# Patient Record
Sex: Female | Born: 1965 | Race: White | Hispanic: No | Marital: Single | Smoking: Never smoker
Health system: Southern US, Community
[De-identification: ages and names within clinical notes are randomized; demographics above are authoritative.]

## PROBLEM LIST (undated history)

## (undated) DIAGNOSIS — I471 Supraventricular tachycardia, unspecified: Secondary | ICD-10-CM

## (undated) DIAGNOSIS — E119 Type 2 diabetes mellitus without complications: Secondary | ICD-10-CM

## (undated) DIAGNOSIS — G43101 Migraine with aura, not intractable, with status migrainosus: Secondary | ICD-10-CM

## (undated) DIAGNOSIS — M7501 Adhesive capsulitis of right shoulder: Secondary | ICD-10-CM

## (undated) DIAGNOSIS — N950 Postmenopausal bleeding: Secondary | ICD-10-CM

## (undated) DIAGNOSIS — R87610 Atypical squamous cells of undetermined significance on cytologic smear of cervix (ASC-US): Secondary | ICD-10-CM

## (undated) HISTORY — DX: Migraine with aura, not intractable, with status migrainosus: G43.101

## (undated) HISTORY — PX: CHOLECYSTECTOMY: SHX55

## (undated) HISTORY — PX: APPENDECTOMY: SHX54

## (undated) HISTORY — DX: Atypical squamous cells of undetermined significance on cytologic smear of cervix (ASC-US): R87.610

## (undated) HISTORY — DX: Supraventricular tachycardia: I47.1

## (undated) HISTORY — DX: Postmenopausal bleeding: N95.0

## (undated) HISTORY — DX: Supraventricular tachycardia, unspecified: I47.10

## (undated) HISTORY — DX: Type 2 diabetes mellitus without complications: E11.9

## (undated) HISTORY — DX: Adhesive capsulitis of right shoulder: M75.01

---

## 1996-11-11 HISTORY — PX: GALLBLADDER SURGERY: SHX652

## 2001-09-09 LAB — HIV ANTIBODY (ROUTINE TESTING W REFLEX): HIV 1&2 Ab, 4th Generation: NEGATIVE

## 2014-06-06 DIAGNOSIS — Z794 Long term (current) use of insulin: Secondary | ICD-10-CM | POA: Insufficient documentation

## 2014-06-06 DIAGNOSIS — E119 Type 2 diabetes mellitus without complications: Secondary | ICD-10-CM | POA: Insufficient documentation

## 2014-06-06 DIAGNOSIS — Z8742 Personal history of other diseases of the female genital tract: Secondary | ICD-10-CM | POA: Insufficient documentation

## 2014-06-06 LAB — HM PAP SMEAR: HM Pap smear: NORMAL

## 2014-11-11 HISTORY — PX: SHOULDER SURGERY: SHX246

## 2014-11-24 DIAGNOSIS — M7521 Bicipital tendinitis, right shoulder: Secondary | ICD-10-CM | POA: Insufficient documentation

## 2014-11-24 DIAGNOSIS — M7501 Adhesive capsulitis of right shoulder: Secondary | ICD-10-CM | POA: Insufficient documentation

## 2014-11-24 DIAGNOSIS — M25511 Pain in right shoulder: Secondary | ICD-10-CM | POA: Insufficient documentation

## 2015-01-26 DIAGNOSIS — M75111 Incomplete rotator cuff tear or rupture of right shoulder, not specified as traumatic: Secondary | ICD-10-CM | POA: Insufficient documentation

## 2016-10-01 DIAGNOSIS — R002 Palpitations: Secondary | ICD-10-CM | POA: Insufficient documentation

## 2016-10-01 DIAGNOSIS — R0789 Other chest pain: Secondary | ICD-10-CM | POA: Insufficient documentation

## 2017-12-16 DIAGNOSIS — IMO0002 Reserved for concepts with insufficient information to code with codable children: Secondary | ICD-10-CM | POA: Insufficient documentation

## 2018-04-14 DIAGNOSIS — I471 Supraventricular tachycardia: Secondary | ICD-10-CM | POA: Insufficient documentation

## 2018-04-24 LAB — MICROALBUMIN, URINE: Microalb, Ur: NORMAL

## 2018-06-12 LAB — HM COLONOSCOPY

## 2018-09-29 LAB — HM MAMMOGRAPHY

## 2018-10-11 DIAGNOSIS — R87610 Atypical squamous cells of undetermined significance on cytologic smear of cervix (ASC-US): Secondary | ICD-10-CM | POA: Insufficient documentation

## 2018-10-13 LAB — CBC AND DIFFERENTIAL
HCT: 43 (ref 36–46)
Hemoglobin: 13.8 (ref 12.0–16.0)
Platelets: 242 (ref 150–399)
WBC: 4.5

## 2018-10-13 LAB — BASIC METABOLIC PANEL
BUN: 11 (ref 4–21)
CO2: 27 — AB (ref 13–22)
Chloride: 111 — AB (ref 99–108)
Creatinine: 1.1 (ref 0.5–1.1)
Glucose: 110
Potassium: 4.4 (ref 3.4–5.3)
Sodium: 141 (ref 137–147)

## 2018-10-13 LAB — HEPATIC FUNCTION PANEL
ALT: 27 (ref 7–35)
AST: 21 (ref 13–35)
Alkaline Phosphatase: 102 (ref 25–125)
Bilirubin, Total: 0.9

## 2018-10-13 LAB — LIPID PANEL
Cholesterol: 111 (ref 0–200)
HDL: 41 (ref 35–70)
LDL Cholesterol: 57
Triglycerides: 65 (ref 40–160)

## 2018-10-13 LAB — HEMOGLOBIN A1C: Hemoglobin A1C: 6.6

## 2018-10-13 LAB — TSH: TSH: 2.42 (ref 0.41–5.90)

## 2018-10-13 LAB — COMPREHENSIVE METABOLIC PANEL: Calcium: 8.7 (ref 8.7–10.7)

## 2018-10-13 LAB — VITAMIN B12: Vitamin B-12: 238

## 2019-07-09 LAB — BASIC METABOLIC PANEL
BUN: 17 (ref 4–21)
CO2: 26 — AB (ref 13–22)
Chloride: 106 (ref 99–108)
Creatinine: 1.2 — AB (ref 0.5–1.1)
Glucose: 111
Potassium: 4.3 (ref 3.4–5.3)
Sodium: 141 (ref 137–147)

## 2019-07-09 LAB — LIPID PANEL
Cholesterol: 106 (ref 0–200)
HDL: 42 (ref 35–70)
LDL Cholesterol: 50
Triglycerides: 68 (ref 40–160)

## 2019-07-09 LAB — COMPREHENSIVE METABOLIC PANEL: Calcium: 9 (ref 8.7–10.7)

## 2019-08-12 LAB — HM DIABETES EYE EXAM

## 2020-01-10 ENCOUNTER — Ambulatory Visit: Payer: Self-pay | Admitting: Family

## 2020-01-12 ENCOUNTER — Encounter: Payer: Self-pay | Admitting: Family

## 2020-01-12 DIAGNOSIS — N950 Postmenopausal bleeding: Secondary | ICD-10-CM | POA: Insufficient documentation

## 2020-01-14 ENCOUNTER — Ambulatory Visit (INDEPENDENT_AMBULATORY_CARE_PROVIDER_SITE_OTHER): Payer: 59 | Admitting: Family

## 2020-01-14 ENCOUNTER — Encounter: Payer: Self-pay | Admitting: Family

## 2020-01-14 ENCOUNTER — Encounter (INDEPENDENT_AMBULATORY_CARE_PROVIDER_SITE_OTHER): Payer: Self-pay

## 2020-01-14 ENCOUNTER — Other Ambulatory Visit: Payer: Self-pay

## 2020-01-14 VITALS — BP 120/80 | HR 69 | Temp 97.7°F | Ht 66.8 in | Wt 234.0 lb

## 2020-01-14 DIAGNOSIS — B379 Candidiasis, unspecified: Secondary | ICD-10-CM | POA: Diagnosis not present

## 2020-01-14 DIAGNOSIS — I471 Supraventricular tachycardia: Secondary | ICD-10-CM

## 2020-01-14 DIAGNOSIS — Z6836 Body mass index (BMI) 36.0-36.9, adult: Secondary | ICD-10-CM | POA: Diagnosis not present

## 2020-01-14 DIAGNOSIS — F4310 Post-traumatic stress disorder, unspecified: Secondary | ICD-10-CM

## 2020-01-14 DIAGNOSIS — E119 Type 2 diabetes mellitus without complications: Secondary | ICD-10-CM | POA: Diagnosis not present

## 2020-01-14 DIAGNOSIS — Z794 Long term (current) use of insulin: Secondary | ICD-10-CM

## 2020-01-14 DIAGNOSIS — E669 Obesity, unspecified: Secondary | ICD-10-CM

## 2020-01-14 MED ORDER — ATORVASTATIN CALCIUM 20 MG PO TABS: 20.0000 mg | ORAL_TABLET | Freq: Every day | ORAL | 0 refills | Status: DC

## 2020-01-14 MED ORDER — LOSARTAN POTASSIUM 25 MG PO TABS: 25.0000 mg | ORAL_TABLET | Freq: Every day | ORAL | 3 refills | Status: DC

## 2020-01-14 MED ORDER — FLUCONAZOLE 150 MG PO TABS: 150.0000 mg | ORAL_TABLET | Freq: Once | ORAL | 0 refills | Status: AC

## 2020-01-14 NOTE — Patient Instructions (Signed)
Continue on current medication.

## 2020-01-14 NOTE — Progress Notes (Signed)
Provider: Webb Silversmith Amylee Lodato FNP-C   Shari Prows, [facility]  Patient Care Team: [facility] as PCP - General  Extended Emergency Contact Information Primary Emergency Contact: Arnoldo Morale Mobile Phone: [phone] Relation: Other  Code Status: DNR  Goals of care: Advanced Directive information Advanced Directives 01/14/2020  Does Patient Have a Medical Advance Directive? Yes  Type of Paramedic of [place];Out of facility DNR (pink MOST or yellow form)  Does patient want to make changes to medical advance directive? No - Patient declined  Copy of [place] in Chart? No - copy requested  Pre-existing out of facility DNR order (yellow form or pink MOST form) Yellow form placed in chart (order not valid for inpatient use)     Chief Complaint  Patient presents with  . Establish Care    New Patient    HPI:  Pt is a 54 y.o. female seen today to establish care for medical management of chronic diseases.States recently relocated from [place] previously following up with Dr.Irizarry,LaurenDelia at [facility].she has a medical history of Type 2 Diabetes mellitus,Migraine with Aura,Supraventricular Tachycardia,Right shoulder pain, complex PTSD see a therapist,hx of PCOS, ASCUS of cervix with negative high risk HPV among other conditions. She complains of vaginal discharge and itching.she request referral to Gynecology for pap smear.  Type 2 Diabetes - No home CBG log.Records reviewed Latest Hgb A1C 6.6( 2019) currently on Jardiance 10 mg tablet daily and Levermir 30 units daily.also on Losartan 25 mg tablet daily for renal protection.Atorvastatin 20 mg tablet daily and ASA 81 mg tablet daily for cardiovascular event prevention. She exercise by walking.Just getting to know around her new neighborhood.  Supraventricular Tachycardia - Heart rate controlled on Metoprolol.she was seeing a cardiologist at [facility] currently weaning  off metoprolol 100 mg 24 Hr tablet.States was advised to take 50 mg tablet daily x 2 weeks then every other day for another 2 weeks.she just started weaning off.she denies any palpitation,chest pain or shortness of breath.she states will eventually need refer to a cardiologist here in [place] when she is weaned off metoprolol.   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  Migraine with Aura - has migraine once in a while.Has vision aura.not taking any medication.Has used Imetrex in the past but not helpful.  No fall episodes. Health maintenance -  She due for annual foot exam with Podiatrist but declines states monofilament exam usually done by PCP.Need referral to Gyn this visit for Pap smear. Will schedule for her Physical exam and fasting lab work  Immunization record reviewed up to date.  She status has a Do not Resuscitate order form she will bring form to her next visit.    Past Medical History:  Diagnosis Date  . Adhesive capsulitis of right shoulder   . ASCUS of cervix with negative high risk HPV   . Diabetes mellitus without complication ([place])   . Migraine with aura and with status migrainosus, not intractable   . Postmenopausal bleeding   . SVT (supraventricular tachycardia) (HCC)    Past Surgical History:  Procedure Laterality Date  . APPENDECTOMY    . GALLBLADDER SURGERY  1998  . SHOULDER SURGERY  2016    Allergies  Allergen Reactions  . Metformin Diarrhea and Nausea Only  . Oxycodone Itching  . Oxycodone-Acetaminophen Itching    Allergies  as of 01/14/2020      Reactions   Metformin Diarrhea, Nausea Only   Oxycodone Itching   Oxycodone-acetaminophen Itching      Medication List       Accurate as of January 14, 2020  6:10 PM. If you have any questions, ask your nurse or doctor.        ACCU-CHEK ADVANTAGE H TEST VI USE 1 EACH  (1 STRIP TOTAL) ONCE DAILY TO CHECK BLOOD SUGAR   aspirin 81 MG EC tablet Take 1 tablet by mouth daily.   atorvastatin 20 MG tablet Commonly known as: LIPITOR Take 1 tablet (20 mg total) by mouth daily.   empagliflozin 10 MG Tabs tablet Commonly known as: JARDIANCE Take 10 mg by mouth daily.   Fifty50 Pen Needles 31G X 8 MM Misc Generic drug: Insulin Pen Needle Inject 1 each as directed daily as needed.   fluconazole 150 MG tablet Commonly known as: DIFLUCAN Take 1 tablet (150 mg total) by mouth once for 1 dose. Repeat one tablet in 1 week Started by: Sandrea Hughs, NP   insulin detemir 100 UNIT/ML FlexPen Commonly known as: LEVEMIR Inject 30 Units into the skin daily.   losartan 25 MG tablet Commonly known as: COZAAR Take 1 tablet (25 mg total) by mouth daily.   metoprolol succinate 100 MG 24 hr tablet Commonly known as: TOPROL-XL Take 1 tablet by mouth daily.       Review of Systems  Constitutional: Negative for appetite change, chills, fatigue and fever.  HENT: Negative for congestion, ear discharge, ear pain, postnasal drip, rhinorrhea, sinus pressure, sinus pain, sneezing, sore throat, tinnitus and trouble swallowing.   Eyes: Positive for visual disturbance. Negative for pain, discharge, redness and itching.       Follows up with ophthalmology annually   Respiratory: Negative for cough, chest tightness, shortness of breath and wheezing.   Cardiovascular: Negative for chest pain, palpitations and leg swelling.       ST follows up with Cardiologist   Gastrointestinal: Negative for abdominal distention, abdominal pain, constipation, diarrhea, nausea and vomiting.  Endocrine: Negative for cold intolerance, heat intolerance, polydipsia, polyphagia and polyuria.       Hot flushes   Genitourinary: Positive for vaginal discharge. Negative for difficulty urinating, dysuria, flank pain, frequency, hematuria, urgency, vaginal bleeding and vaginal pain.       Vaginal  itching  Musculoskeletal: Positive for arthralgias. Negative for gait problem and myalgias.       Bilateral shoulder stiffness   Neurological: Negative for dizziness, syncope, speech difficulty, weakness, light-headedness, numbness and headaches.       Occasional migraines   Hematological: Does not bruise/bleed easily.  Psychiatric/Behavioral: Negative for agitation, confusion, self-injury, sleep disturbance and suicidal ideas. The patient is not nervous/anxious.        Complex PTSD sees Therapist     Immunization History  Administered Date(s) Administered  . Influenza,inj,Quad PF,6+ Mos 07/21/2015, 08/10/2016, 09/12/2017, 11/10/2018  . Influenza-Unspecified 08/19/2014, 08/21/2019  . Pneumococcal Polysaccharide-23 07/21/2015  . Tdap 06/21/2014  . Zoster Recombinat (Shingrix) 07/14/2018, 10/13/2018   Pertinent  Health Maintenance Due  Topic Date Due  . FOOT EXAM  03/23/1976  . PAP SMEAR-Modifier  06/06/2017  . HEMOGLOBIN A1C  04/14/2019  . OPHTHALMOLOGY EXAM  08/11/2020  . MAMMOGRAM  09/29/2020  . COLONOSCOPY  06/12/2028  . INFLUENZA VACCINE  Completed   Fall Risk  01/14/2020  Falls in the past year? 0  Number falls in past yr: 0  Injury with  Fall? 0    Vitals:   01/14/20 1052  BP: 120/80  Pulse: 69  Temp: 97.7 F (36.5 C)  TempSrc: Tympanic  SpO2: 96%  Weight: 234 lb (106.1 kg)  Height: 5' 6.8" (1.697 m)   Body mass index is 36.87 kg/m. Physical Exam Vitals reviewed.  Constitutional:      General: She is not in acute distress.    Appearance: She is obese. She is not ill-appearing.  HENT:     Head: Normocephalic.     Right Ear: Tympanic membrane, ear canal and external ear normal. There is no impacted cerumen.     Left Ear: Tympanic membrane, ear canal and external ear normal. There is no impacted cerumen.     Nose: Nose normal. No congestion or rhinorrhea.     Mouth/Throat:     Mouth: Mucous membranes are moist.     Pharynx: Oropharynx is clear. No  oropharyngeal exudate or posterior oropharyngeal erythema.  Eyes:     General: No scleral icterus.       Right eye: No discharge.        Left eye: No discharge.     Extraocular Movements: Extraocular movements intact.     Conjunctiva/sclera: Conjunctivae normal.     Pupils: Pupils are equal, round, and reactive to light.     Comments: Corrective lens in place   Neck:     Vascular: No carotid bruit.  Cardiovascular:     Rate and Rhythm: Normal rate and regular rhythm.     Pulses: Normal pulses.     Heart sounds: Normal heart sounds. No murmur. No friction rub. No gallop.   Pulmonary:     Effort: Pulmonary effort is normal. No respiratory distress.     Breath sounds: Normal breath sounds. No wheezing, rhonchi or rales.  Chest:     Chest wall: No tenderness.  Abdominal:     General: Bowel sounds are normal. There is no distension.     Palpations: Abdomen is soft. There is no mass.     Tenderness: There is no abdominal tenderness. There is no right CVA tenderness, left CVA tenderness, guarding or rebound.  Musculoskeletal:        General: No swelling or tenderness. Normal range of motion.     Cervical back: Normal range of motion. No rigidity or tenderness.     Right lower leg: No edema.     Left lower leg: No edema.  Lymphadenopathy:     Cervical: No cervical adenopathy.  Skin:    General: Skin is warm.     Coloration: Skin is not jaundiced or pale.     Findings: No bruising, erythema or rash.  Neurological:     Mental Status: She is alert and oriented to person, place, and time.     Cranial Nerves: No cranial nerve deficit.     Motor: No weakness.     Coordination: Coordination normal.     Gait: Gait normal.  Psychiatric:        Mood and Affect: Mood normal.        Behavior: Behavior normal.        Thought Content: Thought content normal.        Judgment: Judgment normal.    Labs reviewed: Recent Labs    07/09/19 0000  NA 141  K 4.3  CL 106  CO2 26*  BUN 17    CREATININE 1.2*  CALCIUM 9.0    Lab Results  Component Value Date  TSH 2.42 10/13/2018   Lab Results  Component Value Date   HGBA1C 6.6 10/13/2018   Lab Results  Component Value Date   CHOL 106 07/09/2019   HDL 42 07/09/2019   LDLCALC 50 07/09/2019   TRIG 68 07/09/2019    Significant Diagnostic Results in last 30 days:  No results found.  Assessment/Plan 1. Type 2 diabetes mellitus without complication, with long-term current use of insulin (HCC) Lab Results  Component Value Date   HGBA1C 6.6 10/13/2018  No CBG for review.continue on Jardiance 10 mg tablet daily and Levermir 30 units daily.Continue on Losartan 25 mg tablet daily for renal protection.Atorvastatin 20 mg tablet daily and ASA 81 mg tablet daily for cardiovascular event prevention. - latest MicroAlbumin ration < 2.0  - Latest LDL at goal. - losartan (COZAAR) 25 MG tablet; Take 1 tablet (25 mg total) by mouth daily.  Dispense: 30 tablet; Refill: 3 - atorvastatin (LIPITOR) 20 MG tablet; Take 1 tablet (20 mg total) by mouth daily.  Dispense: 30 tablet; Refill: 0 - CBC with Differential/Platelet; Future - CMP with eGFR([facility]); Future - TSH; Future - Lipid panel; Future - Vitamin B12; Future  2. Candidosis Multiple infections since starting on Jardice  - fluconazole (DIFLUCAN) 150 MG tablet; Take 1 tablet (150 mg total) by mouth once for 1 dose. Repeat one tablet in 1 week  Dispense: 2 tablet; Refill: 0 - Ambulatory referral to Gynecology  3. SVT (supraventricular tachycardia) (HCC) Heart Rate controlled.Cardiologist weaning off metoprolol  - continue Metoprolol as directed by Cardiologist.  - CBC with Differential/Platelet; Future - CMP with eGFR([facility]); Future  4. Body mass index (BMI) of 36.0-36.9 in adult BMI 36.87  - continue dietary modification and exercises.Advised to exercise at least 3 times per week for 30 minutes. - CBC with Differential/Platelet; Future - CMP with eGFR([facility]); Future -  Lipid panel; Future  5. Obesity (BMI 30-39.9) Dietary and lifestyle modification advised as above. - CBC with Differential/Platelet; Future - CMP with eGFR([facility]); Future - Lipid panel; Future  6. PTSD (post-traumatic stress disorder) Continue to follow up with Therapist. - TSH; Future  Family/ staff Communication: Reviewed plan of care with patient verbalized understanding.  Labs/tests ordered:  - CBC with Differential/Platelet; Future - CMP with eGFR([facility]); Future - TSH; Future - Lipid panel; Future - Vitamin B12; Future  Next Appointment : 2 weeks for Physical Exam and 2-4 days for lab work prior to visit.  Sandrea Hughs, NP

## 2020-01-27 ENCOUNTER — Other Ambulatory Visit: Payer: Self-pay

## 2020-01-28 ENCOUNTER — Ambulatory Visit (INDEPENDENT_AMBULATORY_CARE_PROVIDER_SITE_OTHER): Payer: 59 | Admitting: Obstetrics & Gynecology

## 2020-01-28 ENCOUNTER — Encounter: Payer: Self-pay | Admitting: Obstetrics & Gynecology

## 2020-01-28 VITALS — BP 124/80 | Ht 67.0 in | Wt 235.0 lb

## 2020-01-28 DIAGNOSIS — R87610 Atypical squamous cells of undetermined significance on cytologic smear of cervix (ASC-US): Secondary | ICD-10-CM | POA: Diagnosis not present

## 2020-01-28 DIAGNOSIS — Z01419 Encounter for gynecological examination (general) (routine) without abnormal findings: Secondary | ICD-10-CM | POA: Diagnosis not present

## 2020-01-28 DIAGNOSIS — Z6836 Body mass index (BMI) 36.0-36.9, adult: Secondary | ICD-10-CM

## 2020-01-28 DIAGNOSIS — B3731 Acute candidiasis of vulva and vagina: Secondary | ICD-10-CM

## 2020-01-28 DIAGNOSIS — B373 Candidiasis of vulva and vagina: Secondary | ICD-10-CM | POA: Diagnosis not present

## 2020-01-28 DIAGNOSIS — Z780 Asymptomatic menopausal state: Secondary | ICD-10-CM

## 2020-01-28 DIAGNOSIS — Z1151 Encounter for screening for human papillomavirus (HPV): Secondary | ICD-10-CM

## 2020-01-28 MED ORDER — FLUCONAZOLE 150 MG PO TABS: 150.0000 mg | ORAL_TABLET | ORAL | 4 refills | Status: DC
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
## 2020-01-28 NOTE — Addendum Note (Signed)
Addended by: Berna Spare A on: 01/28/2020 09:38 AM   Modules accepted: Orders

## 2020-01-28 NOTE — Patient Instructions (Signed)
1. Encounter for routine gynecological examination with Papanicolaou smear of cervix Normal gynecologic exam in menopause.  Pap test with high-risk HPV done today.  Breast exam normal.  Will schedule a screening mammogram at the breast center now.  Colonoscopy in 2019.  Health labs with family physician schedule next week.  IDDM also on Jardiance.  2. ASCUS of cervix with negative high risk HPV Last Pap test showed ASCUS with negative high-risk HPV.  Pap test with high-risk HPV done today.  3. Postmenopause Well on no hormone replacement therapy.  No postmenopausal bleeding since summer 2019 for which an endometrial biopsy was done and showed benign results.  4. Yeast vaginitis Recurrent yeast vaginitis since started on Jardiance.  Fluconazole 150 mg 1 tablet every 2 weeks for prevention and treatment.  Usage reviewed and prescription sent to pharmacy.  Could also use boric acid over-the-counter.  5. Class 2 severe obesity due to excess calories with serious comorbidity and body mass index (BMI) of 36.0 to 36.9 in adult [facility]) Patient is on a diabetic mellitus diet.  Will adjust the total calories per day and increase fitness activities as she settles in [place].  Other orders - fluconazole (DIFLUCAN) 150 MG tablet; Take 1 tablet (150 mg total) by mouth every 14 (fourteen) days.  Glorious, it was a pleasure meeting you today!  I will inform you of your results as soon as they are available.

## 2020-01-31 ENCOUNTER — Other Ambulatory Visit: Payer: Self-pay | Admitting: Family

## 2020-01-31 DIAGNOSIS — Z1231 Encounter for screening mammogram for malignant neoplasm of breast: Secondary | ICD-10-CM

## 2020-02-01 LAB — PAP, TP IMAGING W/ HPV RNA, RFLX HPV TYPE 16,18/45: HPV DNA High Risk: NOT DETECTED

## 2020-02-02 ENCOUNTER — Encounter: Payer: Self-pay | Admitting: *Deleted

## 2020-02-02 ENCOUNTER — Other Ambulatory Visit: Payer: 59

## 2020-02-02 ENCOUNTER — Other Ambulatory Visit: Payer: Self-pay

## 2020-02-02 DIAGNOSIS — E669 Obesity, unspecified: Secondary | ICD-10-CM

## 2020-02-02 DIAGNOSIS — F4310 Post-traumatic stress disorder, unspecified: Secondary | ICD-10-CM

## 2020-02-02 DIAGNOSIS — I471 Supraventricular tachycardia: Secondary | ICD-10-CM

## 2020-02-02 DIAGNOSIS — Z6836 Body mass index (BMI) 36.0-36.9, adult: Secondary | ICD-10-CM

## 2020-02-02 DIAGNOSIS — Z794 Long term (current) use of insulin: Secondary | ICD-10-CM

## 2020-02-02 DIAGNOSIS — E119 Type 2 diabetes mellitus without complications: Secondary | ICD-10-CM

## 2020-02-02 LAB — CBC WITH DIFFERENTIAL/PLATELET
Absolute Monocytes: 348 cells/uL (ref 200–950)
Basophils Absolute: 59 cells/uL (ref 0–200)
Basophils Relative: 1.2 %
Eosinophils Absolute: 147 cells/uL (ref 15–500)
Eosinophils Relative: 3 %
HCT: 43.9 % (ref 35.0–45.0)
Hemoglobin: 14.5 g/dL (ref 11.7–15.5)
Lymphs Abs: 1509 cells/uL (ref 850–3900)
MCH: 29.5 pg (ref 27.0–33.0)
MCHC: 33 g/dL (ref 32.0–36.0)
MCV: 89.2 fL (ref 80.0–100.0)
MPV: 10.3 fL (ref 7.5–12.5)
Monocytes Relative: 7.1 %
Neutro Abs: 2837 cells/uL (ref 1500–7800)
Neutrophils Relative %: 57.9 %
Platelets: 275 10*3/uL (ref 140–400)
RBC: 4.92 10*6/uL (ref 3.80–5.10)
RDW: 13.8 % (ref 11.0–15.0)
Total Lymphocyte: 30.8 %
WBC: 4.9 10*3/uL (ref 3.8–10.8)

## 2020-02-02 LAB — COMPLETE METABOLIC PANEL WITH GFR
AG Ratio: 1.2 (calc) (ref 1.0–2.5)
ALT: 14 U/L (ref 6–29)
AST: 15 U/L (ref 10–35)
Albumin: 3.7 g/dL (ref 3.6–5.1)
Alkaline phosphatase (APISO): 129 U/L (ref 37–153)
BUN/Creatinine Ratio: 11 (calc) (ref 6–22)
BUN: 13 mg/dL (ref 7–25)
CO2: 26 mmol/L (ref 20–32)
Calcium: 8.8 mg/dL (ref 8.6–10.4)
Chloride: 109 mmol/L (ref 98–110)
Creat: 1.14 mg/dL — ABNORMAL HIGH (ref 0.50–1.05)
GFR, Est African American: 64 mL/min/{1.73_m2} (ref >=60)
GFR, Est Non African American: 55 mL/min/{1.73_m2} — ABNORMAL LOW (ref >=60)
Globulin: 3 g/dL (calc) (ref 1.9–3.7)
Glucose, Bld: 87 mg/dL (ref 65–99)
Potassium: 4 mmol/L (ref 3.5–5.3)
Sodium: 143 mmol/L (ref 135–146)
Total Bilirubin: 0.4 mg/dL (ref 0.2–1.2)
Total Protein: 6.7 g/dL (ref 6.1–8.1)

## 2020-02-02 LAB — LIPID PANEL
Cholesterol: 106 mg/dL (ref <200)
HDL: 44 mg/dL — ABNORMAL LOW (ref >=50)
LDL Cholesterol (Calc): 44 mg/dL (calc)
Non-HDL Cholesterol (Calc): 62 mg/dL (calc) (ref <130)
Total CHOL/HDL Ratio: 2.4 (calc) (ref <5.0)
Triglycerides: 99 mg/dL (ref <150)

## 2020-02-02 LAB — TSH: TSH: 2.05 mIU/L

## 2020-02-02 LAB — VITAMIN B12: Vitamin B-12: 473 pg/mL (ref 200–1100)

## 2020-02-04 ENCOUNTER — Encounter: Payer: Self-pay | Admitting: Family

## 2020-02-04 ENCOUNTER — Ambulatory Visit (INDEPENDENT_AMBULATORY_CARE_PROVIDER_SITE_OTHER): Payer: 59 | Admitting: Family

## 2020-02-04 ENCOUNTER — Other Ambulatory Visit: Payer: Self-pay

## 2020-02-04 VITALS — BP 120/70 | HR 108 | Temp 98.3°F | Ht 67.0 in | Wt 233.0 lb

## 2020-02-04 DIAGNOSIS — Z794 Long term (current) use of insulin: Secondary | ICD-10-CM | POA: Diagnosis not present

## 2020-02-04 DIAGNOSIS — E119 Type 2 diabetes mellitus without complications: Secondary | ICD-10-CM | POA: Diagnosis not present

## 2020-02-04 DIAGNOSIS — I471 Supraventricular tachycardia: Secondary | ICD-10-CM

## 2020-02-04 DIAGNOSIS — Z0001 Encounter for general adult medical examination with abnormal findings: Secondary | ICD-10-CM | POA: Diagnosis not present

## 2020-02-04 NOTE — Patient Instructions (Signed)
Health Maintenance, Female Adopting a healthy lifestyle and getting preventive care are important in promoting health and wellness. Ask your health care provider about:  The right schedule for you to have regular tests and exams.  Things you can do on your own to prevent diseases and keep yourself healthy. What should I know about diet, weight, and exercise? Eat a healthy diet   Eat a diet that includes plenty of vegetables, fruits, low-fat dairy products, and lean protein.  Do not eat a lot of foods that are high in solid fats, added sugars, or sodium. Maintain a healthy weight Body mass index (BMI) is used to identify weight problems. It estimates body fat based on height and weight. Your health care provider can help determine your BMI and help you achieve or maintain a healthy weight. Get regular exercise Get regular exercise. This is one of the most important things you can do for your health. Most adults should:  Exercise for at least 150 minutes each week. The exercise should increase your heart rate and make you sweat (moderate-intensity exercise).  Do strengthening exercises at least twice a week. This is in addition to the moderate-intensity exercise.  Spend less time sitting. Even light physical activity can be beneficial. Watch cholesterol and blood lipids Have your blood tested for lipids and cholesterol at 54 years of age, then have this test every 5 years. Have your cholesterol levels checked more often if:  Your lipid or cholesterol levels are high.  You are older than 54 years of age.  You are at high risk for heart disease. What should I know about cancer screening? Depending on your health history and family history, you may need to have cancer screening at various ages. This may include screening for:  Breast cancer.  Cervical cancer.  Colorectal cancer.  Skin cancer.  Lung cancer. What should I know about heart disease, diabetes, and high blood  pressure? Blood pressure and heart disease  High blood pressure causes heart disease and increases the risk of stroke. This is more likely to develop in people who have high blood pressure readings, are of African descent, or are overweight.  Have your blood pressure checked: ? Every 3-5 years if you are 18-39 years of age. ? Every year if you are 40 years old or older. Diabetes Have regular diabetes screenings. This checks your fasting blood sugar level. Have the screening done:  Once every three years after age 40 if you are at a normal weight and have a low risk for diabetes.  More often and at a younger age if you are overweight or have a high risk for diabetes. What should I know about preventing infection? Hepatitis B If you have a higher risk for hepatitis B, you should be screened for this virus. Talk with your health care provider to find out if you are at risk for hepatitis B infection. Hepatitis C Testing is recommended for:  Everyone born from 1945 through 1965.  Anyone with known risk factors for hepatitis C. Sexually transmitted infections (STIs)  Get screened for STIs, including gonorrhea and chlamydia, if: ? You are sexually active and are younger than 54 years of age. ? You are older than 54 years of age and your health care provider tells you that you are at risk for this type of infection. ? Your sexual activity has changed since you were last screened, and you are at increased risk for chlamydia or gonorrhea. Ask your health care provider if   you are at risk.  Ask your health care provider about whether you are at high risk for HIV. Your health care provider may recommend a prescription medicine to help prevent HIV infection. If you choose to take medicine to prevent HIV, you should first get tested for HIV. You should then be tested every 3 months for as long as you are taking the medicine. Pregnancy  If you are about to stop having your period (premenopausal) and  you may become pregnant, seek counseling before you get pregnant.  Take 400 to 800 micrograms (mcg) of folic acid every day if you become pregnant.  Ask for birth control (contraception) if you want to prevent pregnancy. Osteoporosis and menopause Osteoporosis is a disease in which the bones lose minerals and strength with aging. This can result in bone fractures. If you are 65 years old or older, or if you are at risk for osteoporosis and fractures, ask your health care provider if you should:  Be screened for bone loss.  Take a calcium or vitamin D supplement to lower your risk of fractures.  Be given hormone replacement therapy (HRT) to treat symptoms of menopause. Follow these instructions at home: Lifestyle  Do not use any products that contain nicotine or tobacco, such as cigarettes, e-cigarettes, and chewing tobacco. If you need help quitting, ask your health care provider.  Do not use street drugs.  Do not share needles.  Ask your health care provider for help if you need support or information about quitting drugs. Alcohol use  Do not drink alcohol if: ? Your health care provider tells you not to drink. ? You are pregnant, may be pregnant, or are planning to become pregnant.  If you drink alcohol: ? Limit how much you use to 0-1 drink a day. ? Limit intake if you are breastfeeding.  Be aware of how much alcohol is in your drink. In the [place] one drink equals one 12 oz bottle of beer (355 mL), one 5 oz glass of wine (148 mL), or one 1 oz glass of hard liquor (44 mL). General instructions  Schedule regular health, dental, and eye exams.  Stay current with your vaccines.  Tell your health care provider if: ? You often feel depressed. ? You have ever been abused or do not feel safe at home. Summary  Adopting a healthy lifestyle and getting preventive care are important in promoting health and wellness.  Follow your health care provider's instructions about healthy  diet, exercising, and getting tested or screened for diseases.  Follow your health care provider's instructions on monitoring your cholesterol and blood pressure. This information is not intended to replace advice given to you by your health care provider. Make sure you discuss any questions you have with your health care provider. Document Revised: 10/21/2018 Document Reviewed: 10/21/2018 Elsevier Patient Education  2020 Elsevier Inc.     Why follow it? Research shows. . Those who follow the Mediterranean diet have a reduced risk of heart disease  . The diet is associated with a reduced incidence of Parkinson's and Alzheimer's diseases . People following the diet may have longer life expectancies and lower rates of chronic diseases  . The Dietary Guidelines for Americans recommends the Mediterranean diet as an eating plan to promote health and prevent disease  What Is the Mediterranean Diet?  . Healthy eating plan based on typical foods and recipes of Mediterranean-style cooking . The diet is primarily a plant based diet; these foods should make up   a majority of meals   Starches - Plant based foods should make up a majority of meals - They are an important sources of vitamins, minerals, energy, antioxidants, and fiber - Choose whole grains, foods high in fiber and minimally processed items  - Typical grain sources include wheat, oats, barley, corn, brown rice, bulgar, farro, millet, polenta, couscous  - Various types of beans include chickpeas, lentils, fava beans, black beans, white beans   Fruits  Veggies - Large quantities of antioxidant rich fruits & veggies; 6 or more servings  - Vegetables can be eaten raw or lightly drizzled with oil and cooked  - Vegetables common to the traditional Mediterranean Diet include: artichokes, arugula, beets, broccoli, brussel sprouts, cabbage, carrots, celery, collard greens, cucumbers, eggplant, kale, leeks, lemons, lettuce, mushrooms, okra, onions,  peas, peppers, potatoes, pumpkin, radishes, rutabaga, shallots, spinach, sweet potatoes, turnips, zucchini - Fruits common to the Mediterranean Diet include: apples, apricots, avocados, cherries, clementines, dates, figs, grapefruits, grapes, melons, nectarines, oranges, peaches, pears, pomegranates, strawberries, tangerines  Fats - Replace butter and margarine with healthy oils, such as olive oil, canola oil, and tahini  - Limit nuts to no more than a handful a day  - Nuts include walnuts, almonds, pecans, pistachios, pine nuts  - Limit or avoid candied, honey roasted or heavily salted nuts - Olives are central to the Mediterranean diet - can be eaten whole or used in a variety of dishes   Meats Protein - Limiting red meat: no more than a few times a month - When eating red meat: choose lean cuts and keep the portion to the size of deck of cards - Eggs: approx. 0 to 4 times a week  - Fish and lean poultry: at least 2 a week  - Healthy protein sources include, chicken, turkey, lean beef, lamb - Increase intake of seafood such as tuna, salmon, trout, mackerel, shrimp, scallops - Avoid or limit high fat processed meats such as sausage and bacon  Dairy - Include moderate amounts of low fat dairy products  - Focus on healthy dairy such as fat free yogurt, skim milk, low or reduced fat cheese - Limit dairy products higher in fat such as whole or 2% milk, cheese, ice cream  Alcohol - Moderate amounts of red wine is ok  - No more than 5 oz daily for women (all ages) and men older than age 65  - No more than 10 oz of wine daily for men younger than 65  Other - Limit sweets and other desserts  - Use herbs and spices instead of salt to flavor foods  - Herbs and spices common to the traditional Mediterranean Diet include: basil, bay leaves, chives, cloves, cumin, fennel, garlic, lavender, marjoram, mint, oregano, parsley, pepper, rosemary, sage, savory, sumac, tarragon, thyme   It's not just a diet,  it's a lifestyle:  . The Mediterranean diet includes lifestyle factors typical of those in the region  . Foods, drinks and meals are best eaten with others and savored . Daily physical activity is important for overall good health . This could be strenuous exercise like running and aerobics . This could also be more leisurely activities such as walking, housework, yard-work, or taking the stairs . Moderation is the key; a balanced and healthy diet accommodates most foods and drinks . Consider portion sizes and frequency of consumption of certain foods   Meal Ideas & Options:  . Breakfast:  o Whole wheat toast or whole wheat English muffins with   peanut butter & hard boiled egg o Steel cut oats topped with apples & cinnamon and skim milk  o Fresh fruit: banana, strawberries, melon, berries, peaches  o Smoothies: strawberries, bananas, greek yogurt, peanut butter o Low fat greek yogurt with blueberries and granola  o Egg white omelet with spinach and mushrooms o Breakfast couscous: whole wheat couscous, apricots, skim milk, cranberries  . Sandwiches:  o Hummus and grilled vegetables (peppers, zucchini, squash) on whole wheat bread   o Grilled chicken on whole wheat pita with lettuce, tomatoes, cucumbers or tzatziki  o Tuna salad on whole wheat bread: tuna salad made with greek yogurt, olives, red peppers, capers, green onions o Garlic rosemary lamb pita: lamb sauted with garlic, rosemary, salt & pepper; add lettuce, cucumber, greek yogurt to pita - flavor with lemon juice and black pepper  . Seafood:  o Mediterranean grilled salmon, seasoned with garlic, basil, parsley, lemon juice and black pepper o Shrimp, lemon, and spinach whole-grain pasta salad made with low fat greek yogurt  o Seared scallops with lemon orzo  o Seared tuna steaks seasoned salt, pepper, coriander topped with tomato mixture of olives, tomatoes, olive oil, minced garlic, parsley, green onions and cappers  . Meats:   o Herbed greek chicken salad with kalamata olives, cucumber, feta  o Red bell peppers stuffed with spinach, bulgur, lean ground beef (or lentils) & topped with feta   o Kebabs: skewers of chicken, tomatoes, onions, zucchini, squash  o Turkey burgers: made with red onions, mint, dill, lemon juice, feta cheese topped with roasted red peppers . Vegetarian o Cucumber salad: cucumbers, artichoke hearts, celery, red onion, feta cheese, tossed in olive oil & lemon juice  o Hummus and whole grain pita points with a greek salad (lettuce, tomato, feta, olives, cucumbers, red onion) o Lentil soup with celery, carrots made with vegetable broth, garlic, salt and pepper  o Tabouli salad: parsley, bulgur, mint, scallions, cucumbers, tomato, radishes, lemon juice, olive oil, salt and pepper.      

## 2020-02-04 NOTE — Progress Notes (Signed)
Patient ID: Shelly Whitehead, female   DOB: 02-24-1966, 54 y.o.   MRN: [id number]  Provider: Webb Silversmith Nerea Bordenave FNP-C   Shelly Whitehead, Shelly Bucks, NP  Patient Care Team: Eliette Drumwright, Shelly Bucks, NP as PCP - General (Family Medicine)  Extended Emergency Contact Information Primary Emergency Contact: Arnoldo Morale Mobile Phone: [phone] Relation: Other  Code Status: Full Code  Goals of care: Advanced Directive information Advanced Directives 01/14/2020  Does Patient Have a Medical Advance Directive? Yes  Type of Paramedic of [place];Out of facility DNR (pink MOST or yellow form)  Does patient want to make changes to medical advance directive? No - Patient declined  Copy of [place] in Chart? No - copy requested  Pre-existing out of facility DNR order (yellow form or pink MOST form) Yellow form placed in chart (order not valid for inpatient use)     Chief Complaint  Patient presents with  . Annual Exam    CPE    HPI:  Pt is a 55 y.o. female seen today for Physical Examination and medical management of chronic diseases.she has a medical history of Type 2 diabetes,Supraventricular Tachycardia,ASCUS of cervix and hx of right rotator cuff tear. she states was driving coming for visit felt like her heart was pounding.she has been tapering down on her metoprolol succinate took last 50 mg tablet yesterday.she denies any chest pain or palpitation. States had a pap smear and breast exam done by Gynecologist.States Pap smear results was normal no malignancy previous Pap smear showed ASCUS with negative high-risk HPV.she states was started on diflucan maintainenance dose due to frequent yeast vaginitis since starting on Jardiance. Immunization reviewed up to date. She is due for annual diabetic eye exam 08/2020.she declines referral to Podiatrist for annual foot exam. Had colonoscopy 2019 She was seen by the dentist yesterday.   Past Medical History:  Diagnosis Date    . Adhesive capsulitis of right shoulder   . ASCUS of cervix with negative high risk HPV   . Diabetes mellitus without complication ([place])   . Migraine with aura and with status migrainosus, not intractable   . Postmenopausal bleeding   . SVT (supraventricular tachycardia) (HCC)    Past Surgical History:  Procedure Laterality Date  . APPENDECTOMY    . GALLBLADDER SURGERY  1998  . SHOULDER SURGERY  2016    Allergies  Allergen Reactions  . Metformin Diarrhea and Nausea Only  . Oxycodone Itching  . Oxycodone-Acetaminophen Itching    Allergies as of 02/04/2020      Reactions   Metformin Diarrhea, Nausea Only   Oxycodone Itching   Oxycodone-acetaminophen Itching      Medication List       Accurate as of February 04, 2020  2:11 PM. If you have any questions, ask your nurse or doctor.        ACCU-CHEK ADVANTAGE H TEST VI USE 1 EACH (1 STRIP TOTAL) ONCE DAILY TO CHECK BLOOD SUGAR   aspirin 81 MG EC tablet Take 1 tablet by mouth daily.   atorvastatin 20 MG tablet Commonly known as: LIPITOR Take 1 tablet (20 mg total) by mouth daily.   empagliflozin 10 MG Tabs tablet Commonly known as: JARDIANCE Take 10 mg by mouth daily.   Fifty50 Pen Needles 31G X 8 MM Misc Generic drug: Insulin Pen Needle Inject 1 each as directed daily as needed.   fluconazole 150 MG tablet Commonly known as: DIFLUCAN Take 1 tablet (150 mg total) by mouth every 14 (fourteen) days.  insulin detemir 100 UNIT/ML FlexPen Commonly known as: LEVEMIR Inject 30 Units into the skin daily.   losartan 25 MG tablet Commonly known as: COZAAR Take 1 tablet (25 mg total) by mouth daily.   metoprolol succinate 100 MG 24 hr tablet Commonly known as: TOPROL-XL Take 1 tablet by mouth daily.       Review of Systems  Constitutional: Negative for appetite change, chills, fatigue and fever.  HENT: Negative for congestion, dental problem, rhinorrhea, sinus pressure, sinus pain, sneezing, sore throat, tinnitus  and trouble swallowing.   Eyes: Negative for discharge, redness, itching and visual disturbance.  Respiratory: Negative for cough, chest tightness, shortness of breath and wheezing.   Cardiovascular: Negative for chest pain, palpitations and leg swelling.  Gastrointestinal: Negative for abdominal distention, abdominal pain, constipation, diarrhea, nausea and vomiting.  Endocrine: Negative for cold intolerance, heat intolerance, polydipsia, polyphagia and polyuria.  Genitourinary: Negative for decreased urine volume, difficulty urinating, dysuria, flank pain, frequency, urgency and vaginal bleeding.       Frequent Vaginal yeast infection due to jardiance  Musculoskeletal: Negative for arthralgias, gait problem, joint swelling and myalgias.  Skin: Negative for color change, pallor and rash.  Neurological: Negative for dizziness, speech difficulty, weakness, light-headedness, numbness and headaches.  Hematological: Does not bruise/bleed easily.  Psychiatric/Behavioral: Negative for agitation and sleep disturbance. The patient is not nervous/anxious.     Immunization History  Administered Date(s) Administered  . Influenza,inj,Quad PF,6+ Mos 07/21/2015, 08/10/2016, 09/12/2017, 11/10/2018  . Influenza-Unspecified 08/19/2014, 08/21/2019  . Moderna SARS-COVID-2 Vaccination 01/18/2020  . Pneumococcal Polysaccharide-23 07/21/2015  . Tdap 06/21/2014  . Zoster Recombinat (Shingrix) 07/14/2018, 10/13/2018   Pertinent  Health Maintenance Due  Topic Date Due  . HEMOGLOBIN A1C  04/14/2019  . FOOT EXAM  02/08/2021 (Originally 03/23/1976)  . OPHTHALMOLOGY EXAM  08/11/2020  . MAMMOGRAM  09/29/2020  . PAP SMEAR-Modifier  01/28/2023  . COLONOSCOPY  06/12/2028  . INFLUENZA VACCINE  Completed   Fall Risk  02/04/2020 01/14/2020  Falls in the past year? 0 0  Number falls in past yr: 0 0  Injury with Fall? 0 0    Vitals:   02/04/20 1326  BP: 120/70  Pulse: (!) 108  Temp: 98.3 F (36.8 C)  TempSrc:  Tympanic  SpO2: 97%  Weight: 233 lb (105.7 kg)  Height: 5' 7"  (1.702 m)   Body mass index is 36.49 kg/m. Physical Exam  Labs reviewed: Recent Labs    07/09/19 0000 02/02/20 0840  NA 141 143  K 4.3 4.0  CL 106 109  CO2 26* 26  GLUCOSE  --  87  BUN 17 13  CREATININE 1.2* 1.14*  CALCIUM 9.0 8.8   Recent Labs    02/02/20 0840  AST 15  ALT 14  BILITOT 0.4  PROT 6.7   Recent Labs    02/02/20 0840  WBC 4.9  NEUTROABS 2,837  HGB 14.5  HCT 43.9  MCV 89.2  PLT 275   Lab Results  Component Value Date   TSH 2.05 02/02/2020   Lab Results  Component Value Date   HGBA1C 6.6 10/13/2018   Lab Results  Component Value Date   CHOL 106 02/02/2020   HDL 44 (L) 02/02/2020   LDLCALC 44 02/02/2020   TRIG 99 02/02/2020   CHOLHDL 2.4 02/02/2020    Significant Diagnostic Results in last 30 days:  No results found.  Assessment/Plan 1. SVT (supraventricular tachycardia) (HCC) HR elevated upon arrival 108 b/min rechecked 98 b/min at rest. - EKG 12-Lead  indicates Sinus rhythm HR 95 b/min no previous EKG for comparison.  Has stopped Metoprolol as weaned off by Cardiologist.took 50 mg tablet yesterday.was following up with cardiologist at [place] states wanted to wean off metoprolol since she has taken it for several years.would like to establish with cardiologist here in [place]. - Advised to restart Metoprolol succinate 50 mg 24 Hr tablet daily until she sees the Cardiologist.verbalized understanding.  - Ambulatory referral to Cardiology  2. Encounter for general adult medical examination with abnormal findings Up to date with immunization. Medication and labs reviewed patient counselled regarding yearly exam, prevention of dental and periodontal disease, diet, regular sustained exercise for at least 30 minutes x 3 /week,COVID-19 hand hygiene, mask and social distancing per CDC guidelines. Proper use of sun screen and protective clothing, recommended though decline doesn't use  sun screen. schedule for routine labs.  - CBC with Differential/Platelet; Future - CMP with eGFR([facility]); Future - Lipid panel; Future  3. Type 2 diabetes mellitus without complication, with long-term current use of insulin ([address]) Lab Results  Component Value Date   HGBA1C 6.6 10/13/2018  Continue current medication. - CBC with Differential/Platelet; Future - CMP with eGFR([facility]); Future - TSH; Future - Hemoglobin A1c; Future  Family/ staff Communication: Reviewed plan of care with patient  Labs/tests ordered:  - CBC with Differential/Platelet; Future - CMP with eGFR([facility]); Future - TSH; Future - Hemoglobin A1c; Future  Next Appointment : 6 months for medical management of chronic issues.labs 2-4 days prior to visit.    Sandrea Hughs, NP

## 2020-02-07 ENCOUNTER — Other Ambulatory Visit: Payer: Self-pay

## 2020-02-07 ENCOUNTER — Ambulatory Visit
Admission: RE | Admit: 2020-02-07 | Discharge: 2020-02-07 | Disposition: A | Discharge status: Home / Self Care | Payer: 59 | Source: Ambulatory Visit

## 2020-02-07 DIAGNOSIS — Z1231 Encounter for screening mammogram for malignant neoplasm of breast: Secondary | ICD-10-CM

## 2020-02-09 ENCOUNTER — Encounter: Payer: Self-pay | Admitting: *Deleted

## 2020-02-09 NOTE — Progress Notes (Signed)
Mammogram showed no evidence of malignancy.Mammogram screening in one year.

## 2020-02-10 ENCOUNTER — Ambulatory Visit (INDEPENDENT_AMBULATORY_CARE_PROVIDER_SITE_OTHER): Payer: 59 | Admitting: Cardiology

## 2020-02-10 ENCOUNTER — Encounter: Payer: Self-pay | Admitting: Cardiology

## 2020-02-10 ENCOUNTER — Other Ambulatory Visit: Payer: Self-pay

## 2020-02-10 VITALS — BP 112/78 | HR 81 | Temp 97.2°F | Ht 67.0 in | Wt 231.0 lb

## 2020-02-10 DIAGNOSIS — R9431 Abnormal electrocardiogram [ECG] [EKG]: Secondary | ICD-10-CM | POA: Diagnosis not present

## 2020-02-10 DIAGNOSIS — R002 Palpitations: Secondary | ICD-10-CM | POA: Diagnosis not present

## 2020-02-10 DIAGNOSIS — I351 Nonrheumatic aortic (valve) insufficiency: Secondary | ICD-10-CM

## 2020-02-10 MED ORDER — METOPROLOL SUCCINATE ER 50 MG PO TB24: 50.0000 mg | ORAL_TABLET | Freq: Every day | ORAL | 2 refills | Status: DC

## 2020-02-10 NOTE — Progress Notes (Signed)
Cardiology Office Note:    Date:  02/10/2020   ID:  Shelly Whitehead, DOB February 10, 1966, MRN [id number]  PCP:  Sandrea Hughs, NP  Cardiologist:  No primary care provider on file.  Electrophysiologist:  None   Referring MD: Sandrea Hughs, NP   Chief Complaint  Patient presents with  . Palpitations    History of Present Illness:    Shelly Whitehead is a 54 y.o. female with a hx of diabetes, hypertension, possible SVT who is referred by [name], NP for evaluation of palpitations.  She previously was followed at [facility] for cardiology.  She was taking Toprol-XL for palpitations.  She had a nuclear stress test on 11/08/2016, which was normal.  TTE on 10/08/2016 showed normal LV systolic function, mild diastolic dysfunction, mild AI.  Holter monitor on 09/05/2017 showed no significant arrhythmias.  She had an another Holter on 10/22/2016 which also showed no arrhythmias..  She was empirically started on beta-blocker for presumed SVT.  She had been taking Toprol-XL 100 mg daily, but recently moved to [facility] and tapered off her metoprolol over the course of 1 month.  States that once off the metoprolol she began having palpitations again.  Reports that she had no palpitations when on metoprolol 100 mg daily.  During palpitations, she reports she feels like her heart is pounding and will feel nauseous.  Last for 10 to 15 minutes.  No lightheadedness, syncope, dyspnea, or chest pain.  She saw her PCP and has been restarted on metoprolol 50 mg daily, with improvement in symptoms.  She reports that she drinks 2 cups of coffee per day.  She previously tried stopping caffeine and did not help with her palpitations.  Drinks 1-2 beers per week.  No smoking history.  Father died of MI in 49s.  She walks for 30 minutes 3-4 times per week, and denies any exertional symptoms.     Past Medical History:  Diagnosis Date  . Adhesive capsulitis of right shoulder   . ASCUS of cervix with negative high risk  HPV   . Diabetes mellitus without complication ([place])   . Migraine with aura and with status migrainosus, not intractable   . Postmenopausal bleeding   . SVT (supraventricular tachycardia) (H[facility])     Past Surgical History:  Procedure Laterality Date  . APPENDECTOMY    . GALLBLADDER SURGERY  1998  . SHOULDER SURGERY  2016    Current Medications: Current Meds  Medication Sig  . aspirin 81 MG EC tablet Take 1 tablet by mouth daily.  Marland Kitchen atorvastatin (LIPITOR) 20 MG tablet TAKE 1 TABLET BY MOUTH EVERY DAY  . empagliflozin (JARDIANCE) 10 MG TABS tablet Take 10 mg by mouth daily.  . fluconazole (DIFLUCAN) 150 MG tablet Take 1 tablet (150 mg total) by mouth every 14 (fourteen) days.  . Glucose Blood (ACCU-CHEK ADVANTAGE H TEST VI) USE 1 EACH (1 STRIP TOTAL) ONCE DAILY TO CHECK BLOOD SUGAR  . insulin detemir (LEVEMIR) 100 UNIT/ML FlexPen Inject 30 Units into the skin daily.  . Insulin Pen Needle (FIFTY50 PEN NEEDLES) 31G X 8 MM MISC Inject 1 each as directed daily as needed.  Marland Kitchen losartan (COZAAR) 25 MG tablet Take 1 tablet (25 mg total) by mouth daily.  . metoprolol succinate (TOPROL-XL) 50 MG 24 hr tablet Take 1 tablet (50 mg total) by mouth daily.  . [DISCONTINUED] metoprolol succinate (TOPROL-XL) 50 MG 24 hr tablet Take 50 mg by mouth.      Allergies:   Metformin,  Oxycodone, and Oxycodone-acetaminophen   Social History   Socioeconomic History  . Marital status: Single    Spouse name: Not on file  . Number of children: Not on file  . Years of education: Not on file  . Highest education level: Not on file  Occupational History  . Occupation: attorney  Tobacco Use  . Smoking status: Never Smoker  . Smokeless tobacco: Never Used  Substance and Sexual Activity  . Alcohol use: Yes    Alcohol/week: 2.0 - 3.0 standard drinks    Types: 2 - 3 Cans of beer per week  . Drug use: Never  . Sexual activity: Yes    Partners: Female    Comment: 1st intercourse 54yo  Other Topics Concern  .  Not on file  Social History Narrative   Tobacco use, amount per day now: N/A   Past tobacco use, amount per day:N/A   How many years did you use tobacco:N/A   Alcohol use (drinks per week):1   Diet:   Do you drink/eat things with caffeine: YES   Marital status:     SINGLE                             What year were you married?   Do you live in a house, apartment, assisted living, condo, trailer, etc.? HOUSE   Is it one or more stories? YES   How many persons live in your home? ME   Do you have pets in your home?( please list) 2 CATS   Current or past profession: ATTORNEY   Do you exercise?              NO                    Type and how often?   Do you have a living will? YES   Do you have a DNR form?                                   If not, do you want to discuss one? YES   Do you have signed POA/HPOA forms?    YES                     If so, please bring to you appointment   Social Determinants of Health   Financial Resource Strain:   . Difficulty of Paying Living Expenses:   Food Insecurity:   . Worried About Programme researcher, broadcasting/film/video in the Last Year:   . Barista in the Last Year:   Transportation Needs:   . Freight forwarder (Medical):   Marland Kitchen Lack of Transportation (Non-Medical):   Physical Activity:   . Days of Exercise per Week:   . Minutes of Exercise per Session:   Stress:   . Feeling of Stress :   Social Connections:   . Frequency of Communication with Friends and Family:   . Frequency of Social Gatherings with Friends and Family:   . Attends Religious Services:   . Active Member of Clubs or Organizations:   . Attends Banker Meetings:   Marland Kitchen Marital Status:      Family History: The patient's family history includes Alzheimer's disease in her mother; Cancer in her father.  ROS:   Please see the history of present illness.  All other systems reviewed and are negative.  EKGs/Labs/Other Studies Reviewed:    The following studies were  reviewed today:   EKG:  EKG is  ordered today.  The ekg ordered today demonstrates normal sinus rhythm, rate 81, Q waves in leads III, aVF  Recent Labs: 02/02/2020: ALT 14; BUN 13; Creat 1.14; Hemoglobin 14.5; Platelets 275; Potassium 4.0; Sodium 143; TSH 2.05  Recent Lipid Panel    Component Value Date/Time   CHOL 106 02/02/2020 0840   TRIG 99 02/02/2020 0840   HDL 44 (L) 02/02/2020 0840   CHOLHDL 2.4 02/02/2020 0840   LDLCALC 44 02/02/2020 0840    Physical Exam:    VS:  BP 112/78   Pulse 81   Temp (!) 97.2 F (36.2 C)   Ht 5\' 7"  (1.702 m)   Wt 231 lb (104.8 kg)   SpO2 99%   BMI 36.18 kg/m     Wt Readings from Last 3 Encounters:  02/10/20 231 lb (104.8 kg)  02/04/20 233 lb (105.7 kg)  01/28/20 235 lb (106.6 kg)     01/30/20 nourished, well developed in no acute distress HEENT: Normal NECK: No JVD; No carotid bruits LYMPHATICS: No lymphadenopathy CARDIAC: RRR, no murmurs, rubs, gallops RESPIRATORY:  Clear to auscultation without rales, wheezing or rhonchi  ABDOMEN: Soft, non-tender, non-distended MUSCULOSKELETAL:  No edema; No deformity  SKIN: Warm and dry NEUROLOGIC:  Alert and oriented x 3 PSYCHIATRIC:  Normal affect   ASSESSMENT:    1. Heart palpitations   2. Nonspecific abnormal electrocardiogram (ECG) (EKG)   3. Aortic valve insufficiency, etiology of cardiac valve disease unspecified    PLAN:    Palpitations: Negative Holter monitor x2 during work-up at [facility], but was started on Toprol-XL for empiric treatment of suspected SVT.  Symptoms resolved with metoprolol.  She recently attempted to wean herself off metoprolol but began having palpitations again we will continue Toprol-XL 50 mg daily  Abnormal EKG: Inferior Q waves, will check TTE  Aortic regurgitation: Mild on TTE in 2017, will check TTE as above  RTC in 3 months  Medication Adjustments/Labs and Tests Ordered: Current medicines are reviewed at length with the patient today.  Concerns  regarding medicines are outlined above.  Orders Placed This Encounter  Procedures  . EKG 12-Lead  . ECHOCARDIOGRAM COMPLETE   Meds ordered this encounter  Medications  . metoprolol succinate (TOPROL-XL) 50 MG 24 hr tablet    Sig: Take 1 tablet (50 mg total) by mouth daily.    Dispense:  90 tablet    Refill:  2    Patient Instructions  Medication Instructions:  The current medical regimen is effective;  continue present plan and medications.  *If you need a refill on your cardiac medications before your next appointment, please call your pharmacy*  Testing/Procedures: Echocardiogram - Your physician has requested that you have an echocardiogram. Echocardiography is a painless test that uses sound waves to create images of your heart. It provides your doctor with information about the size and shape of your heart and how well your heart's chambers and valves are working. This procedure takes approximately one hour. There are no restrictions for this procedure. This will be performed at our [facility] location - [address], [address].    Follow-Up: At [facility], you and your health needs are our priority.  As part of our continuing mission to provide you with exceptional heart care, we have created designated Provider Care Teams.  These Care  Teams include your primary Cardiologist (physician) and Advanced Practice Providers (APPs -  Physician Assistants and Nurse Practitioners) who all work together to provide you with the care you need, when you need it.  We recommend signing up for the patient portal called "MyChart".  Sign up information is provided on this After Visit Summary.  MyChart is used to connect with patients for Virtual Visits (Telemedicine).  Patients are able to view lab/test results, encounter notes, upcoming appointments, etc.  Non-urgent messages can be sent to your provider as well.   To learn more about what you can do with MyChart, go to  [email or web address].    Your next appointment:   3 month(s)  The format for your next appointment:   In Person  Provider:   Epifanio Lesches, MD         Signed, Little Ishikawa, MD  02/10/2020 4:27 PM    [facility]

## 2020-02-10 NOTE — Patient Instructions (Signed)
Medication Instructions:  The current medical regimen is effective;  continue present plan and medications.  *If you need a refill on your cardiac medications before your next appointment, please call your pharmacy*  Testing/Procedures: Echocardiogram - Your physician has requested that you have an echocardiogram. Echocardiography is a painless test that uses sound waves to create images of your heart. It provides your doctor with information about the size and shape of your heart and how well your heart's chambers and valves are working. This procedure takes approximately one hour. There are no restrictions for this procedure. This will be performed at our [facility] location - [address].    Follow-Up: At [facility], you and your health needs are our priority.  As part of our continuing mission to provide you with exceptional heart care, we have created designated Provider Care Teams.  These Care Teams include your primary Cardiologist (physician) and Advanced Practice Providers (APPs -  Physician Assistants and Nurse Practitioners) who all work together to provide you with the care you need, when you need it.  We recommend signing up for the patient portal called "MyChart".  Sign up information is provided on this After Visit Summary.  MyChart is used to connect with patients for Virtual Visits (Telemedicine).  Patients are able to view lab/test results, encounter notes, upcoming appointments, etc.  Non-urgent messages can be sent to your provider as well.   To learn more about what you can do with MyChart, go to [email or web address].    Your next appointment:   3 month(s)  The format for your next appointment:   In Person  Provider:   Epifanio Lesches, MD

## 2020-02-23 ENCOUNTER — Ambulatory Visit (HOSPITAL_COMMUNITY): Payer: 59 | Attending: Cardiovascular Disease

## 2020-02-23 ENCOUNTER — Other Ambulatory Visit: Payer: Self-pay

## 2020-02-23 DIAGNOSIS — R002 Palpitations: Secondary | ICD-10-CM | POA: Diagnosis not present

## 2020-03-10 ENCOUNTER — Other Ambulatory Visit: Payer: Self-pay

## 2020-03-10 MED ORDER — FIFTY50 PEN NEEDLES 31G X 8 MM MISC: 1.0000 | Freq: Every day | 0 refills | Status: DC | PRN

## 2020-03-10 MED ORDER — INSULIN DETEMIR 100 UNIT/ML FLEXPEN: 30.0000 [IU] | PEN_INJECTOR | Freq: Every day | SUBCUTANEOUS | 0 refills | Status: DC

## 2020-03-10 NOTE — Telephone Encounter (Signed)
May send prescription to pharmacy per patient's request

## 2020-03-10 NOTE — Telephone Encounter (Signed)
Routed to Richarda Blade NP for her approval

## 2020-05-01 ENCOUNTER — Other Ambulatory Visit: Payer: Self-pay | Admitting: *Deleted

## 2020-05-01 MED ORDER — FREESTYLE LIBRE 14 DAY READER DEVI: 0 refills | Status: DC

## 2020-05-01 MED ORDER — FREESTYLE LIBRE 14 DAY SENSOR MISC: 6 refills | Status: DC

## 2020-05-01 NOTE — Telephone Encounter (Signed)
Is this ok to send in a Rx for.  Pended and sent to [facility] for approval. (dinah out of office)

## 2020-06-02 MED ORDER — INSULIN DETEMIR 100 UNIT/ML FLEXPEN: 30.0000 [IU] | PEN_INJECTOR | Freq: Every day | SUBCUTANEOUS | 1 refills | Status: DC

## 2020-06-02 MED ORDER — EMPAGLIFLOZIN 10 MG PO TABS: 10.0000 mg | ORAL_TABLET | Freq: Every day | ORAL | 1 refills | Status: DC

## 2020-07-11 ENCOUNTER — Ambulatory Visit: Payer: 59 | Admitting: Cardiology

## 2020-07-19 ENCOUNTER — Telehealth: Payer: Self-pay | Admitting: *Deleted

## 2020-07-19 NOTE — Telephone Encounter (Signed)
Received fax from [facility] for Prior Authorization through Cover My Meds. For Jardiance.  Initiated prior Authorization.  Key: [id number] PA Case ID: [id number]  Went into Determination 24-48 hour turn around. Awaiting response.     Hello -   [facility] will be contacting you regarding insurance authorization for Jardiance.  Hopefully the prior letter is in my file, but in case not:   - I have adverse reactions to Metformin, Trulicity and Januvia (GI issues).  Trulicity and Januvia also didn't seem to help. - I have taken Bydureon in the past but while it worked well it impaired my kidney function.  Thank you.  Shelly Whitehead

## 2020-08-02 ENCOUNTER — Ambulatory Visit: Payer: 59 | Admitting: Cardiology

## 2020-08-03 ENCOUNTER — Other Ambulatory Visit: Payer: Self-pay | Admitting: Family

## 2020-08-04 ENCOUNTER — Other Ambulatory Visit: Payer: Self-pay | Admitting: Family

## 2020-08-04 DIAGNOSIS — E119 Type 2 diabetes mellitus without complications: Secondary | ICD-10-CM

## 2020-08-09 MED ORDER — EMPAGLIFLOZIN 10 MG PO TABS
10.0000 mg | ORAL_TABLET | Freq: Every day | ORAL | 1 refills | Status: DC
Start: 2020-08-09 — End: 2021-01-29

## 2020-08-09 NOTE — Telephone Encounter (Signed)
Received fax from [employer] . Jardiance was APPROVED 08/09/20-08/09/2021.  Patient aware and Rx faxed to pharmacy.

## 2020-08-14 ENCOUNTER — Other Ambulatory Visit: Payer: Self-pay

## 2020-08-14 ENCOUNTER — Other Ambulatory Visit: Payer: 59

## 2020-08-14 DIAGNOSIS — Z0001 Encounter for general adult medical examination with abnormal findings: Secondary | ICD-10-CM

## 2020-08-14 DIAGNOSIS — E119 Type 2 diabetes mellitus without complications: Secondary | ICD-10-CM

## 2020-08-15 LAB — CBC WITH DIFFERENTIAL/PLATELET
Absolute Monocytes: 320 cells/uL (ref 200–950)
Basophils Absolute: 28 cells/uL (ref 0–200)
Basophils Relative: 0.7 %
Eosinophils Absolute: 108 cells/uL (ref 15–500)
Eosinophils Relative: 2.7 %
HCT: 42.9 % (ref 35.0–45.0)
Hemoglobin: 14.3 g/dL (ref 11.7–15.5)
Lymphs Abs: 1428 cells/uL (ref 850–3900)
MCH: 31 pg (ref 27.0–33.0)
MCHC: 33.3 g/dL (ref 32.0–36.0)
MCV: 92.9 fL (ref 80.0–100.0)
MPV: 10.6 fL (ref 7.5–12.5)
Monocytes Relative: 8 %
Neutro Abs: 2116 cells/uL (ref 1500–7800)
Neutrophils Relative %: 52.9 %
Platelets: 241 10*3/uL (ref 140–400)
RBC: 4.62 10*6/uL (ref 3.80–5.10)
RDW: 12.1 % (ref 11.0–15.0)
Total Lymphocyte: 35.7 %
WBC: 4 10*3/uL (ref 3.8–10.8)

## 2020-08-15 LAB — TSH: TSH: 1.68 mIU/L

## 2020-08-15 LAB — COMPLETE METABOLIC PANEL WITH GFR
AG Ratio: 1.3 (calc) (ref 1.0–2.5)
ALT: 11 U/L (ref 6–29)
AST: 15 U/L (ref 10–35)
Albumin: 3.7 g/dL (ref 3.6–5.1)
Alkaline phosphatase (APISO): 106 U/L (ref 37–153)
BUN: 20 mg/dL (ref 7–25)
CO2: 26 mmol/L (ref 20–32)
Calcium: 9.3 mg/dL (ref 8.6–10.4)
Chloride: 108 mmol/L (ref 98–110)
Creat: 1.02 mg/dL (ref 0.50–1.05)
GFR, Est African American: 72 mL/min/{1.73_m2} (ref >=60)
GFR, Est Non African American: 62 mL/min/{1.73_m2} (ref >=60)
Globulin: 2.9 g/dL (calc) (ref 1.9–3.7)
Glucose, Bld: 95 mg/dL (ref 65–99)
Potassium: 4.3 mmol/L (ref 3.5–5.3)
Sodium: 142 mmol/L (ref 135–146)
Total Bilirubin: 0.5 mg/dL (ref 0.2–1.2)
Total Protein: 6.6 g/dL (ref 6.1–8.1)

## 2020-08-15 LAB — LIPID PANEL
Cholesterol: 170 mg/dL (ref <200)
HDL: 44 mg/dL — ABNORMAL LOW (ref >=50)
LDL Cholesterol (Calc): 103 mg/dL (calc) — ABNORMAL HIGH
Non-HDL Cholesterol (Calc): 126 mg/dL (calc) (ref <130)
Total CHOL/HDL Ratio: 3.9 (calc) (ref <5.0)
Triglycerides: 132 mg/dL (ref <150)

## 2020-08-15 LAB — HEMOGLOBIN A1C
Hgb A1c MFr Bld: 7.5 % of total Hgb — ABNORMAL HIGH (ref <5.7)
Mean Plasma Glucose: 169 (calc)
eAG (mmol/L): 9.3 (calc)

## 2020-08-16 NOTE — Progress Notes (Signed)
Cardiology Office Note:    Date:  08/17/2020   ID:  Shelly Whitehead, DOB [birth year], MRN [id number]  PCP:  Caesar Bookman, NP  Cardiologist:  No primary care provider on file.  Electrophysiologist:  None   Referring MD: Caesar Bookman, NP   Chief Complaint  Patient presents with  . Palpitations    History of Present Illness:    Shelly Whitehead is a 54 y.o. female with a hx of diabetes, hypertension, possible SVT who presents for follow-up.  She was referred by Richarda Blade, NP for evaluation of palpitations, initially seen on 02/10/2020.  She previously was followed at [facility] for cardiology.  She was taking Toprol-XL for palpitations.  She had a nuclear stress test on 11/08/2016, which was normal.  TTE on 10/08/2016 showed normal LV systolic function, mild diastolic dysfunction, mild AI.  Holter monitor on 09/05/2017 showed no significant arrhythmias.  She had an another Holter on 10/22/2016 which also showed no arrhythmias..  She was empirically started on beta-blocker for presumed SVT.  She had been taking Toprol-XL 100 mg daily, but recently moved to [facility] and tapered off her metoprolol over the course of 1 month.  States that once off the metoprolol she began having palpitations again.  Reports that she had no palpitations when on metoprolol 100 mg daily.  During palpitations, she reports she feels like her heart is pounding and will feel nauseous.  Last for 10 to 15 minutes.  No lightheadedness, syncope, dyspnea, or chest pain.  She saw her PCP and has been restarted on metoprolol 50 mg daily, with improvement in symptoms.  She reports that she drinks 2 cups of coffee per day.  She previously tried stopping caffeine and did not help with her palpitations.  Drinks 1-2 beers per week.  No smoking history.  Father died of MI in 4s.  She walks for 30 minutes 3-4 times per week, and denies any exertional symptoms.  Echocardiogram on 02/23/2020 showed normal biventricular function, no  significant valvular disease.  Since last clinic visit, she reports that she has been doing well. She does state that she continues to have palpitations, occurring a few times per week, lasting for 5 to 10 minutes. Feels like heart is pounding out of her chest during the episodes. Occur at rest. She denies any lightheadedness, syncope, chest pain, dyspnea. Reports she is walking for 30 minutes 2-3 times per week, denies any exertional symptoms.      Past Medical History:  Diagnosis Date  . Adhesive capsulitis of right shoulder   . ASCUS of cervix with negative high risk HPV   . Diabetes mellitus without complication (HCC)   . Migraine with aura and with status migrainosus, not intractable   . Postmenopausal bleeding   . SVT (supraventricular tachycardia) (HCC)     Past Surgical History:  Procedure Laterality Date  . APPENDECTOMY    . GALLBLADDER SURGERY  1998  . SHOULDER SURGERY  2016    Current Medications: Current Meds  Medication Sig  . aspirin 81 MG EC tablet Take 1 tablet by mouth daily.  Marland Kitchen atorvastatin (LIPITOR) 20 MG tablet TAKE 1 TABLET BY MOUTH EVERY DAY  . Continuous Blood Gluc Receiver (FREESTYLE LIBRE 14 DAY READER) DEVI Use to test blood sugar three times daily. Dx: E11.9  . Continuous Blood Gluc Sensor (FREESTYLE LIBRE 14 DAY SENSOR) MISC USE TO TEST BLOOD SUGAR THREE TIMES DAILY. DX E11.9  . empagliflozin (JARDIANCE) 10 MG TABS tablet Take 1 tablet (10  mg total) by mouth daily.  . fluconazole (DIFLUCAN) 150 MG tablet Take 1 tablet (150 mg total) by mouth every 14 (fourteen) days.  . Glucose Blood (ACCU-CHEK ADVANTAGE H TEST VI) USE 1 EACH (1 STRIP TOTAL) ONCE DAILY TO CHECK BLOOD SUGAR  . insulin detemir (LEVEMIR) 100 UNIT/ML FlexPen Inject 30 Units into the skin daily.  . Insulin Pen Needle (FIFTY50 PEN NEEDLES) 31G X 8 MM MISC Inject 1 each as directed daily as needed.  Marland Kitchen losartan (COZAAR) 25 MG tablet TAKE 1 TABLET BY MOUTH EVERY DAY  . metoprolol succinate  (TOPROL-XL) 50 MG 24 hr tablet Take 1 tablet (50 mg total) by mouth daily.     Allergies:   Metformin, Oxycodone, and Oxycodone-acetaminophen   Social History   Socioeconomic History  . Marital status: Single    Spouse name: Not on file  . Number of children: Not on file  . Years of education: Not on file  . Highest education level: Not on file  Occupational History  . Occupation: attorney  Tobacco Use  . Smoking status: Never Smoker  . Smokeless tobacco: Never Used  Vaping Use  . Vaping Use: Never used  Substance and Sexual Activity  . Alcohol use: Yes    Alcohol/week: 2.0 - 3.0 standard drinks    Types: 2 - 3 Cans of beer per week  . Drug use: Never  . Sexual activity: Yes    Partners: Female    Comment: 1st intercourse 54yo  Other Topics Concern  . Not on file  Social History Narrative   Tobacco use, amount per day now: N/A   Past tobacco use, amount per day:N/A   How many years did you use tobacco:N/A   Alcohol use (drinks per week):1   Diet:   Do you drink/eat things with caffeine: YES   Marital status:     SINGLE                             What year were you married?   Do you live in a house, apartment, assisted living, condo, trailer, etc.? HOUSE   Is it one or more stories? YES   How many persons live in your home? ME   Do you have pets in your home?( please list) 2 CATS   Current or past profession: ATTORNEY   Do you exercise?              NO                    Type and how often?   Do you have a living will? YES   Do you have a DNR form?                                   If not, do you want to discuss one? YES   Do you have signed POA/HPOA forms?    YES                     If so, please bring to you appointment   Social Determinants of Health   Financial Resource Strain:   . Difficulty of Paying Living Expenses: Not on file  Food Insecurity:   . Worried About Programme researcher, broadcasting/film/video in the Last Year: Not on file  . Ran Out of Food in the Last  Year: Not on  file  Transportation Needs:   . Lack of Transportation (Medical): Not on file  . Lack of Transportation (Non-Medical): Not on file  Physical Activity:   . Days of Exercise per Week: Not on file  . Minutes of Exercise per Session: Not on file  Stress:   . Feeling of Stress : Not on file  Social Connections:   . Frequency of Communication with Friends and Family: Not on file  . Frequency of Social Gatherings with Friends and Family: Not on file  . Attends Religious Services: Not on file  . Active Member of Clubs or Organizations: Not on file  . Attends BankerClub or Organization Meetings: Not on file  . Marital Status: Not on file     Family History: The patient's family history includes Alzheimer's disease in her mother; Cancer in her father.  ROS:   Please see the history of present illness.     All other systems reviewed and are negative.  EKGs/Labs/Other Studies Reviewed:    The following studies were reviewed today:   EKG:  EKG is  ordered today.  The ekg ordered today demonstrates normal sinus rhythm, rate 59, no ST abnormalities  Recent Labs: 08/14/2020: ALT 11; BUN 20; Creat 1.02; Hemoglobin 14.3; Platelets 241; Potassium 4.3; Sodium 142; TSH 1.68  Recent Lipid Panel    Component Value Date/Time   CHOL 170 08/14/2020 0958   TRIG 132 08/14/2020 0958   HDL 44 (L) 08/14/2020 0958   CHOLHDL 3.9 08/14/2020 0958   LDLCALC 103 (H) 08/14/2020 0958    Physical Exam:    VS:  BP 124/78   Pulse (!) 59   Ht 5\' 7"  (1.702 m)   Wt 232 lb 3.2 oz (105.3 kg)   BMI 36.37 kg/m     Wt Readings from Last 3 Encounters:  08/17/20 232 lb 3.2 oz (105.3 kg)  02/10/20 231 lb (104.8 kg)  02/04/20 233 lb (105.7 kg)     ZOX:WRUEGEN:Well nourished, well developed in no acute distress HEENT: Normal NECK: No JVD; No carotid bruits LYMPHATICS: No lymphadenopathy CARDIAC: RRR, no murmurs, rubs, gallops RESPIRATORY:  Clear to auscultation without rales, wheezing or rhonchi  ABDOMEN: Soft,  non-tender, non-distended MUSCULOSKELETAL:  No edema; No deformity  SKIN: Warm and dry NEUROLOGIC:  Alert and oriented x 3 PSYCHIATRIC:  Normal affect   ASSESSMENT:    1. Palpitations   2. Nonspecific abnormal electrocardiogram (ECG) (EKG)   3. Aortic valve insufficiency, etiology of cardiac valve disease unspecified    PLAN:    Palpitations: Negative Holter monitor x2 [facility], but was started on Toprol-XL for empiric treatment of suspected SVT.  Symptoms improved with metoprolol.  She has continued to have palpitations, will check Zio patch x2 weeks for further evaluation  Abnormal EKG: Inferior Q waves on prior EKG.  Echocardiogram unremarkable  Aortic regurgitation: Mild on TTE in 2017, echo on 02/23/2020 showed trivial AI  RTC in 3 months  Medication Adjustments/Labs and Tests Ordered: Current medicines are reviewed at length with the patient today.  Concerns regarding medicines are outlined above.  Orders Placed This Encounter  Procedures  . LONG TERM MONITOR (3-14 DAYS)  . EKG 12-Lead   No orders of the defined types were placed in this encounter.   Patient Instructions  Medication Instructions:  Your physician recommends that you continue on your current medications as directed. Please refer to the Current Medication list given to you today.  *If you  need a refill on your cardiac medications before your next appointment, please call your pharmacy*  Testing/Procedures:  ZIO XT- Long Term Monitor Instructions   Your physician has requested you wear your ZIO patch monitor 14 days.   This is a single patch monitor.  [employer] supplies one patch monitor per enrollment.  Additional stickers are not available.   Please do not apply patch if you will be having a Nuclear Stress Test, Echocardiogram, Cardiac CT, MRI, or Chest Xray during the time frame you would be wearing the monitor. The patch cannot be worn during these tests.  You cannot remove  and re-apply the ZIO XT patch monitor.   Your ZIO patch monitor will be sent USPS Priority mail from [facility] directly to your home address. The monitor may also be mailed to a PO BOX if home delivery is not available.   It may take 3-5 days to receive your monitor after you have been enrolled.   Once you have received you monitor, please review enclosed instructions.  Your monitor has already been registered assigning a specific monitor serial # to you.   Applying the monitor   Shave hair from upper left chest.   Hold abrader disc by orange tab.  Rub abrader in 40 strokes over left upper chest as indicated in your monitor instructions.   Clean area with 4 enclosed alcohol pads .  Use all pads to assure are is cleaned thoroughly.  Let dry.   Apply patch as indicated in monitor instructions.  Patch will be place under collarbone on left side of chest with arrow pointing upward.   Rub patch adhesive wings for 2 minutes.Remove white label marked "1".  Remove white label marked "2".  Rub patch adhesive wings for 2 additional minutes.   While looking in a mirror, press and release button in center of patch.  A small green light will flash 3-4 times .  This will be your only indicator the monitor has been turned on.     Do not shower for the first 24 hours.  You may shower after the first 24 hours.   Press button if you feel a symptom. You will hear a small click.  Record Date, Time and Symptom in the Patient Log Book.   When you are ready to remove patch, follow instructions on last 2 pages of Patient Log Book.  Stick patch monitor onto last page of Patient Log Book.   Place Patient Log Book in [place] box.  Use locking tab on box and tape box closed securely.  The [employer] has [employer] on it.  Please place in mailbox as soon as possible.  Your physician should have your test results approximately 7 days after the monitor has been mailed back to [facility].   Call  [facility] Customer Care at [phone] if you have questions regarding your ZIO XT patch monitor.  Call them immediately if you see an orange light blinking on your monitor.   If your monitor falls off in less than 4 days contact our Monitor department at [phone].  If your monitor becomes loose or falls off after 4 days call [employer] at [phone] for suggestions on securing your monitor.    Follow-Up: At [facility], you and your health needs are our priority.  As part of our continuing mission to provide you with exceptional heart care, we have created designated Provider Care Teams.  These Care Teams include your primary Cardiologist (physician) and  Advanced Practice Providers (APPs -  Physician Assistants and Nurse Practitioners) who all work together to provide you with the care you need, when you need it.  We recommend signing up for the patient portal called "MyChart".  Sign up information is provided on this After Visit Summary.  MyChart is used to connect with patients for Virtual Visits (Telemedicine).  Patients are able to view lab/test results, encounter notes, upcoming appointments, etc.  Non-urgent messages can be sent to your provider as well.   To learn more about what you can do with MyChart, go to [email or web address].    Your next appointment:   3 month(s)  The format for your next appointment:   In Person  Provider:   Epifanio Lesches, MD         Signed, Little Ishikawa, MD  08/17/2020 10:41 AM    [facility]

## 2020-08-17 ENCOUNTER — Other Ambulatory Visit: Payer: Self-pay

## 2020-08-17 ENCOUNTER — Telehealth: Payer: Self-pay | Admitting: Radiology

## 2020-08-17 ENCOUNTER — Encounter: Payer: Self-pay | Admitting: Cardiology

## 2020-08-17 ENCOUNTER — Ambulatory Visit: Payer: 59 | Admitting: Family

## 2020-08-17 ENCOUNTER — Ambulatory Visit (INDEPENDENT_AMBULATORY_CARE_PROVIDER_SITE_OTHER): Payer: 59 | Admitting: Cardiology

## 2020-08-17 VITALS — BP 124/78 | HR 59 | Ht 67.0 in | Wt 232.2 lb

## 2020-08-17 DIAGNOSIS — I351 Nonrheumatic aortic (valve) insufficiency: Secondary | ICD-10-CM | POA: Diagnosis not present

## 2020-08-17 DIAGNOSIS — R002 Palpitations: Secondary | ICD-10-CM | POA: Diagnosis not present

## 2020-08-17 DIAGNOSIS — R9431 Abnormal electrocardiogram [ECG] [EKG]: Secondary | ICD-10-CM

## 2020-08-17 NOTE — Telephone Encounter (Signed)
Enrolled patient for a 14 day Zio XT  monitor to be mailed to patients home  °

## 2020-08-17 NOTE — Patient Instructions (Signed)
Medication Instructions:  Your physician recommends that you continue on your current medications as directed. Please refer to the Current Medication list given to you today.  *If you need a refill on your cardiac medications before your next appointment, please call your pharmacy*  Testing/Procedures:  ZIO XT- Long Term Monitor Instructions   Your physician has requested you wear your ZIO patch monitor___14___days.   This is a single patch monitor.  [employer] supplies one patch monitor per enrollment.  Additional stickers are not available.   Please do not apply patch if you will be having a Nuclear Stress Test, Echocardiogram, Cardiac CT, MRI, or Chest Xray during the time frame you would be wearing the monitor. The patch cannot be worn during these tests.  You cannot remove and re-apply the ZIO XT patch monitor.   Your ZIO patch monitor will be sent USPS Priority mail from [employer] directly to your home address. The monitor may also be mailed to a PO BOX if home delivery is not available.   It may take 3-5 days to receive your monitor after you have been enrolled.   Once you have received you monitor, please review enclosed instructions.  Your monitor has already been registered assigning a specific monitor serial # to you.   Applying the monitor   Shave hair from upper left chest.   Hold abrader disc by orange tab.  Rub abrader in 40 strokes over left upper chest as indicated in your monitor instructions.   Clean area with 4 enclosed alcohol pads .  Use all pads to assure are is cleaned thoroughly.  Let dry.   Apply patch as indicated in monitor instructions.  Patch will be place under collarbone on left side of chest with arrow pointing upward.   Rub patch adhesive wings for 2 minutes.Remove white label marked "1".  Remove white label marked "2".  Rub patch adhesive wings for 2 additional minutes.   While looking in a mirror, press and release button in center of patch.   A small green light will flash 3-4 times .  This will be your only indicator the monitor has been turned on.     Do not shower for the first 24 hours.  You may shower after the first 24 hours.   Press button if you feel a symptom. You will hear a small click.  Record Date, Time and Symptom in the Patient Log Book.   When you are ready to remove patch, follow instructions on last 2 pages of Patient Log Book.  Stick patch monitor onto last page of Patient Log Book.   Place Patient Log Book in Blue box.  Use locking tab on box and tape box closed securely.  The Orange and [employer] box has prepaid postage on it.  Please place in mailbox as soon as possible.  Your physician should have your test results approximately 7 days after the monitor has been mailed back to [employer].   Call [employer] Customer Care at [phone] if you have questions regarding your ZIO XT patch monitor.  Call them immediately if you see an orange light blinking on your monitor.   If your monitor falls off in less than 4 days contact our Monitor department at [phone].  If your monitor becomes loose or falls off after 4 days call [employer] at [phone] for suggestions on securing your monitor.   Follow-Up: At [facility], you and your health needs are our priority.  As part of our continuing   mission to provide you with exceptional heart care, we have created designated Provider Care Teams.  These Care Teams include your primary Cardiologist (physician) and Advanced Practice Providers (APPs -  Physician Assistants and Nurse Practitioners) who all work together to provide you with the care you need, when you need it.  We recommend signing up for the patient portal called "MyChart".  Sign up information is provided on this After Visit Summary.  MyChart is used to connect with patients for Virtual Visits (Telemedicine).  Patients are able to view lab/test results, encounter notes, upcoming appointments, etc.   Non-urgent messages can be sent to your provider as well.   To learn more about what you can do with MyChart, go to [email or web address].    Your next appointment:   3 month(s)  The format for your next appointment:   In Person  Provider:   Christopher Schumann, MD     

## 2020-08-18 ENCOUNTER — Ambulatory Visit (INDEPENDENT_AMBULATORY_CARE_PROVIDER_SITE_OTHER): Payer: 59 | Admitting: Family

## 2020-08-18 ENCOUNTER — Ambulatory Visit
Admission: RE | Admit: 2020-08-18 | Discharge: 2020-08-18 | Disposition: A | Discharge status: Home / Self Care | Payer: 59 | Source: Ambulatory Visit | Attending: Family | Admitting: Family

## 2020-08-18 ENCOUNTER — Other Ambulatory Visit: Payer: Self-pay | Admitting: Family

## 2020-08-18 ENCOUNTER — Encounter: Payer: Self-pay | Admitting: Family

## 2020-08-18 VITALS — BP 102/70 | HR 72 | Temp 96.1°F | Resp 18 | Ht 67.0 in | Wt 230.6 lb

## 2020-08-18 DIAGNOSIS — E119 Type 2 diabetes mellitus without complications: Secondary | ICD-10-CM | POA: Diagnosis not present

## 2020-08-18 DIAGNOSIS — M25559 Pain in unspecified hip: Secondary | ICD-10-CM | POA: Diagnosis not present

## 2020-08-18 DIAGNOSIS — E785 Hyperlipidemia, unspecified: Secondary | ICD-10-CM | POA: Diagnosis not present

## 2020-08-18 DIAGNOSIS — Z23 Encounter for immunization: Secondary | ICD-10-CM | POA: Diagnosis not present

## 2020-08-18 DIAGNOSIS — Z1159 Encounter for screening for other viral diseases: Secondary | ICD-10-CM

## 2020-08-18 DIAGNOSIS — Z794 Long term (current) use of insulin: Secondary | ICD-10-CM

## 2020-08-18 NOTE — Progress Notes (Signed)
Provider: [name] FNP-C   Shelly Whitehead, Nelda Bucks, NP  Patient Care Team: Alhaji Mcneal, Nelda Bucks, NP as PCP - General (Family Medicine)  Extended Emergency Contact Information Primary Emergency Contact: Arnoldo Morale Mobile Phone: [phone] Relation: Other  Code Status: Full Code  Goals of care: Advanced Directive information Advanced Directives 08/18/2020  Does Patient Have a Medical Advance Directive? Yes  Type of Advance Directive -  Does patient want to make changes to medical advance directive? No - Patient declined  Copy of [place] in Chart? -  Pre-existing out of facility DNR order (yellow form or pink MOST form) -     Chief Complaint  Patient presents with  . Medical Management of Chronic Issues    6 Month Follow Up    HPI:  Pt is a 54 y.o. female seen today for 6 months follow up for medical management of chronic diseases.she denies any acute issues today.Recent lab results reviewed and discussed with patient today.   Type 2 DM - Hgb A1C 7.5 CBG in the varies had one reading in the 50's mostly average > 100's -200's.Ho signs of hyperglycemia when in the 50's.eats two meals lunch and dinner.On Levermir 30 units for over 6 yrs.Also on Jardiance 10 mg tablet daily.Lives with friend who has sodas in the house.     HR in the 90 pounding feeling worst when lying down.No chest pain.shortness of breath.she continue to follow up with Cardiologist seen yesterday by Dr.christopher Gardiner Rhyme for palpitation.she was advise to use Zio patch  X 2 weeks then follow up.Her prior EKG showed inferior Q wave.ECHO 02/23/2020 was unremarkable.Aortic regurgitation; mild on TEE in [address].she was advised to follow up in 3 months. On metoprolol succinate 50 mg tablet daily.  Bilateral hip pain radiating down to lateral thigh area.worst when seated.Pain on right hip worst than the left.unable to cross leg to put on her socks.states used to be flexible.Has not used any analgesics Has  no pain when walking. Has been working from home.she denies any weakness,numbness or tingling on the legs.No B/B incontinence.      Hyperlipidemia - chole 170,LDL 103,TRG 132 on atorvastatin 20 mg tablet daily.has not been watching her diet but will start being cautious.she walks 2-3 times per week.   Hypertension - B/p within normal range today.on Losartan 25 mg tablet daily and  metoprolol succinate 50 mg tablet daily.Has chronic palpitation follows up with Cardiologist.she denies any dizziness,chest pain or shortness of breath. On ASA and Statin.   Past Medical History:  Diagnosis Date  . Adhesive capsulitis of right shoulder   . ASCUS of cervix with negative high risk HPV   . Diabetes mellitus without complication ([place])   . Migraine with aura and with status migrainosus, not intractable   . Postmenopausal bleeding   . SVT (supraventricular tachycardia) (HCC)    Past Surgical History:  Procedure Laterality Date  . APPENDECTOMY    . GALLBLADDER SURGERY  1998  . SHOULDER SURGERY  2016    Allergies  Allergen Reactions  . Metformin Diarrhea and Nausea Only  . Oxycodone Itching  . Oxycodone-Acetaminophen Itching    Allergies as of 08/18/2020      Reactions   Metformin Diarrhea, Nausea Only   Oxycodone Itching   Oxycodone-acetaminophen Itching      Medication List       Accurate as of August 18, 2020  9:12 AM. If you have any questions, ask your nurse or doctor.  ACCU-CHEK ADVANTAGE H TEST VI USE 1 EACH (1 STRIP TOTAL) ONCE DAILY TO CHECK BLOOD SUGAR   aspirin 81 MG EC tablet Take 1 tablet by mouth daily.   atorvastatin 20 MG tablet Commonly known as: LIPITOR TAKE 1 TABLET BY MOUTH EVERY DAY   empagliflozin 10 MG Tabs tablet Commonly known as: JARDIANCE Take 1 tablet (10 mg total) by mouth daily.   Fifty50 Pen Needles 31G X 8 MM Misc Generic drug: Insulin Pen Needle Inject 1 each as directed daily as needed.   fluconazole 150 MG tablet Commonly known  as: DIFLUCAN Take 1 tablet (150 mg total) by mouth every 14 (fourteen) days.   FreeStyle Libre 14 Day Reader Kerrin Mo Use to test blood sugar three times daily. Dx: E11.9   FreeStyle Libre 14 Day Sensor Misc USE TO TEST BLOOD SUGAR THREE TIMES DAILY. DX E11.9   insulin detemir 100 UNIT/ML FlexPen Commonly known as: LEVEMIR Inject 30 Units into the skin daily.   losartan 25 MG tablet Commonly known as: COZAAR TAKE 1 TABLET BY MOUTH EVERY DAY   metoprolol succinate 50 MG 24 hr tablet Commonly known as: TOPROL-XL Take 1 tablet (50 mg total) by mouth daily.       Review of Systems  Constitutional: Negative for appetite change, chills, fatigue, fever and unexpected weight change.  HENT: Negative for congestion, hearing loss, postnasal drip, rhinorrhea, sinus pressure, sinus pain, sneezing, sore throat and trouble swallowing.   Eyes: Positive for visual disturbance. Negative for discharge, redness and itching.       Wears eye glasses has upcoming appointment with Ophthalmology   Respiratory: Negative for cough, chest tightness, shortness of breath and wheezing.   Cardiovascular: Negative for chest pain, palpitations and leg swelling.  Gastrointestinal: Negative for abdominal distention, abdominal pain, constipation, diarrhea, nausea and vomiting.  Endocrine: Negative for cold intolerance, heat intolerance, polydipsia, polyphagia and polyuria.  Genitourinary: Negative for difficulty urinating, dysuria, flank pain, frequency and urgency.  Musculoskeletal: Positive for arthralgias. Negative for back pain, gait problem, joint swelling, myalgias and neck pain.  Skin: Negative for color change, pallor, rash and wound.  Neurological: Negative for dizziness, speech difficulty, weakness, light-headedness and headaches.       Chronic Numbness on fingers   Hematological: Does not bruise/bleed easily.  Psychiatric/Behavioral: Negative for agitation, confusion and sleep disturbance. The patient is  not nervous/anxious.     Immunization History  Administered Date(s) Administered  . Influenza,inj,Quad PF,6+ Mos 07/21/2015, 08/10/2016, 09/12/2017, 11/10/2018  . Influenza-Unspecified 08/19/2014, 08/21/2019  . Moderna SARS-COVID-2 Vaccination 01/18/2020  . Pneumococcal Polysaccharide-23 07/21/2015  . Tdap 06/21/2014  . Zoster Recombinat (Shingrix) 07/14/2018, 10/13/2018   Pertinent  Health Maintenance Due  Topic Date Due  . INFLUENZA VACCINE  06/11/2020  . OPHTHALMOLOGY EXAM  08/11/2020  . FOOT EXAM  02/08/2021 (Originally 03/23/1976)  . HEMOGLOBIN A1C  02/12/2021  . MAMMOGRAM  02/06/2022  . PAP SMEAR-Modifier  01/28/2023  . COLONOSCOPY  06/12/2028   Fall Risk  08/18/2020 02/04/2020 01/14/2020  Falls in the past year? 0 0 0  Number falls in past yr: 0 0 0  Injury with Fall? 0 0 0    Vitals:   08/18/20 0900  BP: 102/70  Pulse: 72  Resp: 18  Temp: (!) 96.1 F (35.6 C)  SpO2: 97%  Weight: 230 lb 9.6 oz (104.6 kg)  Height: 5' 7"  (1.702 m)   Body mass index is 36.12 kg/m. Physical Exam Vitals reviewed.  Constitutional:      General: She  is not in acute distress.    Appearance: She is obese. She is not ill-appearing.  HENT:     Head: Normocephalic.     Right Ear: Tympanic membrane, ear canal and external ear normal. There is no impacted cerumen.     Left Ear: Tympanic membrane, ear canal and external ear normal. There is no impacted cerumen.  Eyes:     General: No scleral icterus.       Right eye: No discharge.        Left eye: No discharge.     Extraocular Movements: Extraocular movements intact.     Conjunctiva/sclera: Conjunctivae normal.     Pupils: Pupils are equal, round, and reactive to light.  Neck:     Vascular: No carotid bruit.  Cardiovascular:     Rate and Rhythm: Normal rate and regular rhythm.     Pulses: Normal pulses.     Heart sounds: Normal heart sounds. No murmur heard.  No friction rub. No gallop.   Pulmonary:     Effort: Pulmonary effort is  normal. No respiratory distress.     Breath sounds: Normal breath sounds. No wheezing, rhonchi or rales.  Chest:     Chest wall: No tenderness.  Abdominal:     General: Bowel sounds are normal. There is no distension.     Palpations: Abdomen is soft. There is no mass.     Tenderness: There is no abdominal tenderness. There is no right CVA tenderness, left CVA tenderness, guarding or rebound.  Musculoskeletal:        General: No swelling or tenderness. Normal range of motion.     Cervical back: Normal range of motion. No rigidity or tenderness.     Right lower leg: No edema.     Left lower leg: No edema.  Lymphadenopathy:     Cervical: No cervical adenopathy.  Skin:    General: Skin is warm.     Coloration: Skin is not pale.     Findings: No bruising, erythema or rash.  Neurological:     Mental Status: She is alert and oriented to person, place, and time.     Cranial Nerves: No cranial nerve deficit.     Sensory: No sensory deficit.     Motor: No weakness.     Gait: Gait normal.  Psychiatric:        Mood and Affect: Mood normal.        Behavior: Behavior normal.        Thought Content: Thought content normal.        Judgment: Judgment normal.    Labs reviewed: Recent Labs    02/02/20 0840 08/14/20 0958  NA 143 142  K 4.0 4.3  CL 109 108  CO2 26 26  GLUCOSE 87 95  BUN 13 20  CREATININE 1.14* 1.02  CALCIUM 8.8 9.3   Recent Labs    02/02/20 0840 08/14/20 0958  AST 15 15  ALT 14 11  BILITOT 0.4 0.5  PROT 6.7 6.6   Recent Labs    02/02/20 0840 08/14/20 0958  WBC 4.9 4.0  NEUTROABS 2,837 2,116  HGB 14.5 14.3  HCT 43.9 42.9  MCV 89.2 92.9  PLT 275 241   Lab Results  Component Value Date   TSH 1.68 08/14/2020   Lab Results  Component Value Date   HGBA1C 7.5 (H) 08/14/2020   Lab Results  Component Value Date   CHOL 170 08/14/2020   HDL 44 (L) 08/14/2020  LDLCALC 103 (H) 08/14/2020   TRIG 132 08/14/2020   CHOLHDL 3.9 08/14/2020    Significant  Diagnostic Results in last 30 days:  No results found.  Assessment/Plan 1. Need for influenza vaccination Afebrile.asymptomatic. Flu vaccine administered by CMA sally Luster. - Flu Vaccine QUAD 6+ mos PF IM (Fluarix Quad PF)  2. Type 2 diabetes mellitus without complication, with long-term current use of insulin (HCC) Lab Results  Component Value Date   HGBA1C 7.5 (H) 08/14/2020  Continue on Levermir 30 units and Jardiance 10 mg tablet daily Continue on ASA,Statin and ARB. - CBC with Differential/Platelet; Future - CMP with eGFR([facility]); Future - TSH; Future - Hemoglobin A1c; Future  3. Hyperlipidemia LDL goal <100 LDL not at goal . Continue on Lipitor 20 mg tablet daily.  - Lipid panel; Future  4. Hip pain Right worst than left with limited crossing leg over let thigh though her weight could be a contributory factor too. - will obtain bilateral hip-pelvic X-ray 2-3 views to rule out acute abnormalities. - advised to take OTC Tylenol 500 mg tablet one by mouth every 6 hrs as needed for pain  - will refer to Orthopedic if indicated may benefit from cortisol injection.   5. Encounter for hepatitis C screening test for low risk patient Asymptomatic.low risk.  - Hep C Antibody; Future  Family/ staff Communication: Reviewed plan of care with patient  Labs/tests ordered:  - CBC with Differential/Platelet; Future - CMP with eGFR([facility]); Future - TSH; Future - Hemoglobin A1c; Future - Lipid panel; Future - Hep C Antibody; Future  Next Appointment : 6 months for medical management of chronic issues.labs 2-4 days prior to visit   Sandrea Hughs, NP

## 2020-08-21 ENCOUNTER — Other Ambulatory Visit: Payer: Self-pay | Admitting: Family

## 2020-08-21 ENCOUNTER — Other Ambulatory Visit (INDEPENDENT_AMBULATORY_CARE_PROVIDER_SITE_OTHER): Payer: 59

## 2020-08-21 DIAGNOSIS — M25552 Pain in left hip: Secondary | ICD-10-CM

## 2020-08-21 DIAGNOSIS — R002 Palpitations: Secondary | ICD-10-CM | POA: Diagnosis not present

## 2020-08-21 DIAGNOSIS — M25551 Pain in right hip: Secondary | ICD-10-CM

## 2020-08-25 ENCOUNTER — Encounter: Payer: Self-pay | Admitting: Orthopaedic Surgery

## 2020-08-25 ENCOUNTER — Ambulatory Visit (INDEPENDENT_AMBULATORY_CARE_PROVIDER_SITE_OTHER): Payer: 59 | Admitting: Orthopaedic Surgery

## 2020-08-25 VITALS — Ht 66.0 in | Wt 232.0 lb

## 2020-08-25 DIAGNOSIS — M25551 Pain in right hip: Secondary | ICD-10-CM | POA: Insufficient documentation

## 2020-08-25 DIAGNOSIS — M25552 Pain in left hip: Secondary | ICD-10-CM

## 2020-08-25 NOTE — Progress Notes (Signed)
Office Visit Note   Patient: Shelly Whitehead           Date of Birth: [birth year]           MRN: [id number] Visit Date: 08/25/2020              Requested by: Caesar Bookman, NP [address] [place],  [place] [address] PCP: Caesar Bookman, NP   Assessment & Plan: Visit Diagnoses:  1. Bilateral hip pain     Plan: Impression is bilateral hip pain.  I recommend physical therapy for IT band stretching and strengthening of abductors.  Patient is not requesting cortisone injection today.  She will try over-the-counter medications as needed.  Follow-up as needed.  Follow-Up Instructions: Return if symptoms worsen or fail to improve.   Orders:  Orders Placed This Encounter  Procedures  . Ambulatory referral to Physical Therapy   No orders of the defined types were placed in this encounter.     Procedures: No procedures performed   Clinical Data: No additional findings.   Subjective: Chief Complaint  Patient presents with  . Right Hip - Pain  . 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  The pain is localized laterally.  She notices increased pain with external rotation of both legs.  Denies any groin pain or back pain or radicular symptoms.  She has not really taken any medications.   Review of Systems  Constitutional: Negative.   HENT: Negative.   Eyes: Negative.   Respiratory: Negative.   Cardiovascular: Negative.   Endocrine: Negative.   Musculoskeletal: Negative.   Neurological: Negative.   Hematological: Negative.   Psychiatric/Behavioral: Negative.   All other systems reviewed and are negative.    Objective: Vital Signs: Ht 5\' 6"  (1.676 m)   Wt 232 lb (105.2 kg)   BMI 37.45 kg/m   Physical Exam Vitals and nursing note reviewed.  Constitutional:      Appearance: She is well-developed.    HENT:     Head: Normocephalic and atraumatic.  Pulmonary:     Effort: Pulmonary effort is normal.  Abdominal:     Palpations: Abdomen is soft.  Musculoskeletal:     Cervical back: Neck supple.  Skin:    General: Skin is warm.     Capillary Refill: Capillary refill takes less than 2 seconds.  Neurological:     Mental Status: She is alert and oriented to person, place, and time.  Psychiatric:        Behavior: Behavior normal.        Thought Content: Thought content normal.        Judgment: Judgment normal.     Ortho Exam Bilateral hips show normal range of motion with some increased pain laterally with external rotation logroll.  Negative Stinchfield.  Negative straight leg.  Lateral hips are tender to palpation.  Pain with FABER test. Specialty Comments:  No specialty comments available.  Imaging: No results found.   PMFS History: Patient Active Problem List   Diagnosis Date Noted  . Bilateral hip pain 08/25/2020  . Postmenopausal bleeding 01/12/2020  . ASCUS of cervix with negative high risk HPV 10/11/2018  . SVT (supraventricular tachycardia) (HCC) 04/14/2018  . Chronic migraine 12/16/2017  . Atypical chest pain 10/01/2016  . Heart palpitations 10/01/2016  .  Incomplete tear of right rotator cuff 01/26/2015  . Adhesive capsulitis of right shoulder 11/24/2014  . Biceps tendinitis of right shoulder 11/24/2014  . Right shoulder pain 11/24/2014  . History of PCOS 06/06/2014  . Type 2 diabetes mellitus without complication, with long-term current use of insulin (HCC) 06/06/2014   Past Medical History:  Diagnosis Date  . Adhesive capsulitis of right shoulder   . ASCUS of cervix with negative high risk HPV   . Diabetes mellitus without complication (HCC)   . Migraine with aura and with status migrainosus, not intractable   . Postmenopausal bleeding   . SVT (supraventricular tachycardia) (HCC)     Family History  Problem Relation Age of Onset  . Alzheimer's disease  Mother   . Cancer Father        Bladder    Past Surgical History:  Procedure Laterality Date  . APPENDECTOMY    . GALLBLADDER SURGERY  1998  . SHOULDER SURGERY  2016   Social History   Occupational History  . Occupation: attorney  Tobacco Use  . Smoking status: Never Smoker  . Smokeless tobacco: Never Used  Vaping Use  . Vaping Use: Never used  Substance and Sexual Activity  . Alcohol use: Yes    Alcohol/week: 2.0 - 3.0 standard drinks    Types: 2 - 3 Cans of beer per week  . Drug use: Never  . Sexual activity: Yes    Partners: Female    Comment: 1st intercourse 54yo

## 2020-09-12 ENCOUNTER — Ambulatory Visit (INDEPENDENT_AMBULATORY_CARE_PROVIDER_SITE_OTHER): Payer: 59 | Admitting: Physical Therapy

## 2020-09-12 ENCOUNTER — Other Ambulatory Visit: Payer: Self-pay

## 2020-09-12 DIAGNOSIS — M25551 Pain in right hip: Secondary | ICD-10-CM

## 2020-09-12 DIAGNOSIS — M6281 Muscle weakness (generalized): Secondary | ICD-10-CM

## 2020-09-12 DIAGNOSIS — M25552 Pain in left hip: Secondary | ICD-10-CM

## 2020-09-12 NOTE — Therapy (Signed)
[facility] Physical Therapy [address] [place], [place], [address] Phone: [phone]   Fax:  [phone]  Physical Therapy Evaluation  Patient Details  Name: Shelly Whitehead MRN: [id number] Date of Birth: [birth year] Referring Provider (PT): Gershon Mussel MD   Encounter Date: 09/12/2020   PT End of Session - 09/12/20 0855    Visit Number 1    Number of Visits 12    Date for PT Re-Evaluation 11/03/20    PT Start Time 0800    PT Stop Time 0846    PT Time Calculation (min) 46 min    Activity Tolerance Patient tolerated treatment well    Behavior During Therapy [facility] for tasks assessed/performed           Past Medical History:  Diagnosis Date   Adhesive capsulitis of right shoulder    ASCUS of cervix with negative high risk HPV    Diabetes mellitus without complication (HCC)    Migraine with aura and with status migrainosus, not intractable    Postmenopausal bleeding    SVT (supraventricular tachycardia) (HCC)     Past Surgical History:  Procedure Laterality Date   APPENDECTOMY     GALLBLADDER SURGERY  1998   SHOULDER SURGERY  2016    There were no vitals filed for this visit.    Subjective Assessment - 09/12/20 0810    Subjective Pt arriving reporting bilateral hip pain R is worse than left. Pt reporting pain with putting on her socks to the point of tears. Pt reporting she has a history of bursitis. Pt reporting pin point pain in bilateral hip and glutes.    Pertinent History DM2, gall bladder removal, appendectomy    Currently in Pain? Yes    Pain Score 2     Pain Location Hip   worse pain is 7/10   Pain Orientation Left;Right    Pain Descriptors / Indicators Aching;Burning    Pain Type Acute pain    Pain Onset More than a month ago    Pain Frequency Intermittent    Aggravating Factors  sitting with my legs up under me while I"m on the couch    Effect of Pain on Daily Activities worse whey lying down, unable to sleep               [facility] PT Assessment - 09/12/20 0001      Assessment   Medical Diagnosis bilateral hip pain: M25.552    Referring Provider (PT) Gershon Mussel MD    Hand Dominance Right    Prior Therapy Yes, R shoulder 2016      Precautions   Precautions None      Restrictions   Weight Bearing Restrictions No      Balance Screen   Has the patient fallen in the past 6 months No    Is the patient reluctant to leave their home because of a fear of falling?  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       Cognition   Overall Cognitive  Status Within Functional Limits for tasks assessed      Posture/Postural Control   Posture/Postural Control Postural limitations    Postural Limitations Rounded Shoulders;Forward head;Decreased lumbar lordosis      ROM / Strength   AROM / PROM / Strength AROM;Strength      AROM   AROM Assessment Site Hip    Right/Left Hip Right;Left    Right Hip Extension 20    Right Hip Flexion 90   PROM: 100   Right Hip External Rotation  40    Right Hip Internal Rotation  40    Right Hip ABduction 30    Left Hip Extension 20    Left Hip Flexion 95   PROM: 110   Left Hip External Rotation  55    Left Hip Internal Rotation  35    Left Hip ABduction 38      Strength   Overall Strength Comments bilateral ankles 5/5    Strength Assessment Site Hip;Knee    Right/Left Hip Right;Left    Right Hip Flexion 4-/5    Right Hip Extension 4-/5    Right Hip External Rotation  4-/5    Right Hip Internal Rotation 4-/5    Right Hip ABduction 4-/5    Right Hip ADduction 4-/5    Left Hip Flexion 4-/5    Left Hip Extension 4-/5     Left Hip External Rotation 4-/5    Left Hip Internal Rotation 4-/5    Left Hip ABduction 4-/5    Left Hip ADduction 4-/5    Right/Left Knee Right;Left    Right Knee Flexion 5/5    Right Knee Extension 5/5    Left Knee Flexion 5/5    Left Knee Extension 5/5      Palpation   Palpation comment TTP, lateral hips bilaterally, bilateral piriformis      Special Tests    Special Tests Hip Special Tests    Hip Special Tests  Luisa Hart (FABER) Test;Hip Scouring      Luisa Hart (FABER) Test   Findings Positive    Side Right    Comments Positive on left      Hip Scouring   Findings Positive    Side Right      Ambulation/Gait   Assistive device None    Gait Comments Pt amb with wide BOS with step through gait pattern.                       Objective measurements completed on examination: See above findings.               PT Education - 09/12/20 0856    Education Details PT POC, discussed DN for possible future visit, edu pt in HEP    Person(s) Educated Patient    Methods Demonstration;Handout;Verbal cues;Tactile cues;Explanation    Comprehension Verbalized understanding;Returned demonstration               PT Long Term Goals - 09/12/20 0851      PT LONG TERM GOAL #1   Title Pt will be independent in her HEP and progression.    Time 6    Period Weeks    Status New    Target Date 01/05/21      PT LONG TERM GOAL #2   Title Pt will be albe to donne/doff her socks with no pain reported.    Time 6    Period Weeks    Status New  Target Date 01/05/21      PT LONG TERM GOAL #3   Title Pt will be able to stand after sitting for 30 minutes with no pain reported.    Time 6    Period Weeks    Status New    Target Date 01/05/21      PT LONG TERM GOAL #4   Title Pt will improve her bilateral hip strength to grossly 4+/5 in order to improve functional mobility and gait.    Time 6    Period Weeks    Status New    Target Date 11/03/20                   Plan - 09/12/20 0856    Clinical Impression Statement Pt presenting today with year long history of chronic bilateral hip pain. Pt reports sitting on her couch with her legs folded under her and inability to stand after prolonged time period. Pt reporting inability to donne/doff her socks. Pt reporting pain upon arrival of 2/10 but pt reporting pain can elevate to 7/10. Pt reporting R hip is worse than L. Pt presenting with mild decrease in AROM with positive FABER test and scouring on the R. Pt reporting inability to sleep at night due to hip pain. Skilled PT needed to address pt's impairments with the below interventions.    Personal Factors and Comorbidities Comorbidity 1    Comorbidities DM    Examination-Activity Limitations Dressing;Sit;Squat;Other    Examination-Participation Restrictions Other;Occupation    Stability/Clinical Decision Making Stable/Uncomplicated    Clinical Decision Making Low    Rehab Potential Good    PT Frequency 2x / week    PT Duration 6 weeks    PT Treatment/Interventions ADLs/Self Care Home Management;Electrical Stimulation;Iontophoresis 4mg /ml Dexamethasone;Moist Heat;Ultrasound;Gait training;Stair training;Functional mobility training;Therapeutic activities;Therapeutic exercise;Balance training;Neuromuscular re-education;Patient/family education;Passive range of motion;Manual techniques;Dry needling;Taping;Cryotherapy    PT Next Visit Plan Assess for possible dry needling, hip flexor stretch, LE strengthening/ stretching,IT band stretching and rolling,  modalities as needed    PT Home Exercise Plan Access Code: [email or web address]    Consulted and Agree with Plan of Care Patient           Patient will benefit from skilled therapeutic intervention in order to improve the following deficits and impairments:  Postural dysfunction, Decreased strength, Impaired flexibility, Pain, Decreased range of motion, Difficulty walking  Visit Diagnosis: Pain in left  hip  Pain in right hip  Muscle weakness (generalized)     Problem List Patient Active Problem List   Diagnosis Date Noted   Bilateral hip pain 08/25/2020   Postmenopausal bleeding 01/12/2020   ASCUS of cervix with negative high risk HPV 10/11/2018   SVT (supraventricular tachycardia) (HCC) 04/14/2018   Chronic migraine 12/16/2017   Atypical chest pain 10/01/2016   Heart palpitations 10/01/2016   Incomplete tear of right rotator cuff 01/26/2015   Adhesive capsulitis of right shoulder 11/24/2014   Biceps tendinitis of right shoulder 11/24/2014   Right shoulder pain 11/24/2014   History of PCOS 06/06/2014   Type 2 diabetes mellitus without complication, with long-term current use of insulin (HCC) 06/06/2014    06/08/2014, PT, MPT 09/12/2020, 9:11 AM  [facility] Physical Therapy [address] [place], [place], [place] Phone: [phone]   Fax:  [phone]  Name: Shelly Whitehead MRN: Damita Lack Date of Birth: 10/13/1966

## 2020-09-21 ENCOUNTER — Encounter: Payer: 59 | Admitting: Rehabilitative and Restorative Service Providers"

## 2020-09-26 ENCOUNTER — Ambulatory Visit (INDEPENDENT_AMBULATORY_CARE_PROVIDER_SITE_OTHER): Payer: 59 | Admitting: Physical Therapy

## 2020-09-26 ENCOUNTER — Other Ambulatory Visit: Payer: Self-pay

## 2020-09-26 ENCOUNTER — Encounter: Payer: Self-pay | Admitting: Physical Therapy

## 2020-09-26 DIAGNOSIS — M25551 Pain in right hip: Secondary | ICD-10-CM

## 2020-09-26 DIAGNOSIS — M6281 Muscle weakness (generalized): Secondary | ICD-10-CM | POA: Diagnosis not present

## 2020-09-26 DIAGNOSIS — M25552 Pain in left hip: Secondary | ICD-10-CM

## 2020-09-26 NOTE — Therapy (Signed)
[facility] Physical Therapy [address] [place], [place], [address] Phone: [phone]   Fax:  [phone]  Physical Therapy Treatment  Patient Details  Name: Shelly Whitehead MRN: [id number] Date of Birth: [birth year] Referring Provider (PT): Frankey Shown MD   Encounter Date: 09/26/2020   PT End of Session - 09/26/20 1054    Visit Number 2    Number of Visits 12    Date for PT Re-Evaluation 11/03/20    PT Start Time 0845    PT Stop Time 0928    PT Time Calculation (min) 43 min    Activity Tolerance Patient tolerated treatment well    Behavior During Therapy [facility] for tasks assessed/performed           Past Medical History:  Diagnosis Date   Adhesive capsulitis of right shoulder    ASCUS of cervix with negative high risk HPV    Diabetes mellitus without complication ([employer])    Migraine with aura and with status migrainosus, not intractable    Postmenopausal bleeding    SVT (supraventricular tachycardia) ([place])     Past Surgical History:  Procedure Laterality Date   [place]  2016    There were no vitals filed for this visit.   Subjective Assessment - 09/26/20 1052    Subjective Pt arriving today reporting  bilateral hip pain R still worse than L. Pt reporting compliance with her HEP.    Pertinent History DM2, gall bladder removal, appendectomy    Patient Stated Goals "I want to walk without pain"    Currently in Pain? Yes    Pain Score 2     Pain Location Hip    Pain Orientation Right;Left    Pain Descriptors / Indicators Aching;Burning    Pain Type Acute pain    Pain Onset More than a month ago    Pain Frequency Intermittent                             [facility] Adult PT Treatment/Exercise - 09/26/20 0001      Exercises   Exercises Knee/Hip      Knee/Hip Exercises: Stretches   Hip Flexor Stretch 2 reps;60 seconds    Piriformis Stretch 3 reps;30 seconds       Knee/Hip Exercises: Aerobic   Nustep L4 x 7 minutes      Knee/Hip Exercises: Standing   Heel Raises Both;20 reps    Hip Abduction Stengthening;Both;2 sets;10 reps    Hip Extension Stengthening;Both;2 sets;10 reps    Forward Step Up Both;Hand Hold: 1;Step Height: 6"      Knee/Hip Exercises: Supine   Bridges Strengthening;10 reps;Limitations    Bridges Limitations holding 5 seconds    Straight Leg Raises Strengthening;10 reps    Other Supine Knee/Hip Exercises clams x 10 green theraband     Other Supine Knee/Hip Exercises ball squeezes x 10 holding 4 seconds                  PT Education - 09/26/20 1053    Education Details exercises technique    Person(s) Educated Patient    Methods Explanation;Demonstration    Comprehension Verbalized understanding;Returned demonstration               PT Long Term Goals - 09/26/20 1056      PT LONG TERM GOAL #1   Title Pt will be independent in  her HEP and progression.    Status On-going      PT LONG TERM GOAL #2   Title Pt will be albe to donne/doff her socks with no pain reported.    Status On-going      PT LONG TERM GOAL #3   Title Pt will be able to stand after sitting for 30 minutes with no pain reported.    Status On-going      PT LONG TERM GOAL #4   Title Pt will improve her bilateral hip strength to grossly 4+/5 in order to improve functional mobility and gait.    Status On-going                 Plan - 09/26/20 1054    Clinical Impression Statement Pt arriving to therapy reproting compliance with her HEP. Pt reporting feeling less tightness in her hips after strating stretches. Pt tolerating exercises well during session. No goals met this session. Continue skilled PT.    Personal Factors and Comorbidities Comorbidity 1    Comorbidities DM    Examination-Activity Limitations Dressing;Sit;Squat;Other    Examination-Participation Restrictions Other;Occupation    Stability/Clinical Decision Making  Stable/Uncomplicated    Rehab Potential Good    PT Frequency 2x / week    PT Treatment/Interventions ADLs/Self Care Home Management;Electrical Stimulation;Iontophoresis 4m/ml Dexamethasone;Moist Heat;Ultrasound;Gait training;Stair training;Functional mobility training;Therapeutic activities;Therapeutic exercise;Balance training;Neuromuscular re-education;Patient/family education;Passive range of motion;Manual techniques;Dry needling;Taping;Cryotherapy    PT Next Visit Plan Assess for possible dry needling, hip flexor stretch, LE strengthening/ stretching,IT band stretching and rolling,  modalities as needed    PT Home Exercise Plan Access Code: [email or web address]   Consulted and Agree with Plan of Care Patient           Patient will benefit from skilled therapeutic intervention in order to improve the following deficits and impairments:  Postural dysfunction, Decreased strength, Impaired flexibility, Pain, Decreased range of motion, Difficulty walking  Visit Diagnosis: Pain in right hip  Pain in left hip  Muscle weakness (generalized)     Problem List Patient Active Problem List   Diagnosis Date Noted   Bilateral hip pain 08/25/2020   Postmenopausal bleeding 01/12/2020   ASCUS of cervix with negative high risk HPV 10/11/2018   SVT (supraventricular tachycardia) (HCC) 04/14/2018   Chronic migraine 12/16/2017   Atypical chest pain 10/01/2016   Heart palpitations 10/01/2016   Incomplete tear of right rotator cuff 01/26/2015   Adhesive capsulitis of right shoulder 11/24/2014   Biceps tendinitis of right shoulder 11/24/2014   Right shoulder pain 11/24/2014   History of PCOS 06/06/2014   Type 2 diabetes mellitus without complication, with long-term current use of insulin ([place] 06/06/2014    [name], MPT 09/26/2020, 10:57 AM  [facility] [address] [address]: [phone]  Fax:   [phone] Name: Shelly HubersMRN: [id number] of Birth: [birth year]

## 2020-09-29 ENCOUNTER — Ambulatory Visit (INDEPENDENT_AMBULATORY_CARE_PROVIDER_SITE_OTHER): Payer: 59 | Admitting: Rehabilitative and Restorative Service Providers"

## 2020-09-29 ENCOUNTER — Other Ambulatory Visit: Payer: Self-pay

## 2020-09-29 ENCOUNTER — Encounter: Payer: Self-pay | Admitting: Rehabilitative and Restorative Service Providers"

## 2020-09-29 DIAGNOSIS — M25551 Pain in right hip: Secondary | ICD-10-CM

## 2020-09-29 DIAGNOSIS — M25552 Pain in left hip: Secondary | ICD-10-CM | POA: Diagnosis not present

## 2020-09-29 DIAGNOSIS — M6281 Muscle weakness (generalized): Secondary | ICD-10-CM | POA: Diagnosis not present

## 2020-09-29 NOTE — Therapy (Signed)
[facility] Physical Therapy [address] [place], [place], [address] Phone: [phone]   Fax:  [phone]  Physical Therapy Treatment  Patient Details  Name: Chenee Munns MRN: [id number] Date of Birth: 1966-04-18 Referring Provider (PT): Gershon Mussel MD   Encounter Date: 09/29/2020   PT End of Session - 09/29/20 0805    Visit Number 3    Number of Visits 12    Date for PT Re-Evaluation 11/03/20    Progress Note Due on Visit 10    PT Start Time 0758    PT Stop Time 0837    PT Time Calculation (min) 39 min    Activity Tolerance Patient tolerated treatment well    Behavior During Therapy [facility] for tasks assessed/performed           Past Medical History:  Diagnosis Date  . Adhesive capsulitis of right shoulder   . ASCUS of cervix with negative high risk HPV   . Diabetes mellitus without complication (HCC)   . Migraine with aura and with status migrainosus, not intractable   . Postmenopausal bleeding   . SVT (supraventricular tachycardia) (HCC)     Past Surgical History:  Procedure Laterality Date  . APPENDECTOMY    . GALLBLADDER SURGERY  1998  . SHOULDER SURGERY  2016    There were no vitals filed for this visit.   Subjective Assessment - 09/29/20 0801    Subjective Pt. stated getting Rt sock on is painful and more trouble than Lt but both can be hard.  Pt. indicated lying down can be painful as well.    Pertinent History DM2, gall bladder removal, appendectomy    Patient Stated Goals "I want to walk without pain"    Currently in Pain? Yes    Pain Score --   mild to moderate   Pain Location Hip    Pain Orientation Right;Left    Pain Type Acute pain    Pain Onset More than a month ago    Aggravating Factors  getting socks on, lying down, getting up                             [facility] Adult PT Treatment/Exercise - 09/29/20 0001      Knee/Hip Exercises: Stretches   Piriformis Stretch 30 seconds;3 reps;Both      Knee/Hip  Exercises: Aerobic   Nustep Lvl 6 11 mins      Knee/Hip Exercises: Supine   Bridges 2 sets;10 reps;Both      Knee/Hip Exercises: Sidelying   Clams Rt sidelying clam shell 5x 10, Lt 3 x 10      Manual Therapy   Manual therapy comments compression c movement to bilateral glute med c clam shell movement, Rt greater                       PT Long Term Goals - 09/26/20 1056      PT LONG TERM GOAL #1   Title Pt will be independent in her HEP and progression.    Status On-going      PT LONG TERM GOAL #2   Title Pt will be albe to donne/doff her socks with no pain reported.    Status On-going      PT LONG TERM GOAL #3   Title Pt will be able to stand after sitting for 30 minutes with no pain reported.    Status On-going  PT LONG TERM GOAL #4   Title Pt will improve her bilateral hip strength to grossly 4+/5 in order to improve functional mobility and gait.    Status On-going                 Plan - 09/29/20 0825    Clinical Impression Statement Positive gains in mobility following manual trigger point release, soft tissue mobility intervention.  Pt. to continue to benefit from manual intervention paired c ther ex for mobility, strength gains to facilitate functional hip movement for daily activity.    Personal Factors and Comorbidities Comorbidity 1    Comorbidities DM    Examination-Activity Limitations Dressing;Sit;Squat;Other    Examination-Participation Restrictions Other;Occupation    Stability/Clinical Decision Making Stable/Uncomplicated    Rehab Potential Good    PT Frequency 2x / week    PT Treatment/Interventions ADLs/Self Care Home Management;Electrical Stimulation;Iontophoresis 4mg /ml Dexamethasone;Moist Heat;Ultrasound;Gait training;Stair training;Functional mobility training;Therapeutic activities;Therapeutic exercise;Balance training;Neuromuscular re-education;Patient/family education;Passive range of motion;Manual techniques;Dry  needling;Taping;Cryotherapy    PT Next Visit Plan Manual myofascial release techniques lateral hip, possible DN.  Continued hip strengthening    PT Home Exercise Plan Access Code: [email or web address]    Consulted and Agree with Plan of Care Patient           Patient will benefit from skilled therapeutic intervention in order to improve the following deficits and impairments:  Postural dysfunction, Decreased strength, Impaired flexibility, Pain, Decreased range of motion, Difficulty walking  Visit Diagnosis: Pain in right hip  Pain in left hip  Muscle weakness (generalized)     Problem List Patient Active Problem List   Diagnosis Date Noted  . Bilateral hip pain 08/25/2020  . Postmenopausal bleeding 01/12/2020  . ASCUS of cervix with negative high risk HPV 10/11/2018  . SVT (supraventricular tachycardia) (HCC) 04/14/2018  . Chronic migraine 12/16/2017  . Atypical chest pain 10/01/2016  . Heart palpitations 10/01/2016  . Incomplete tear of right rotator cuff 01/26/2015  . Adhesive capsulitis of right shoulder 11/24/2014  . Biceps tendinitis of right shoulder 11/24/2014  . Right shoulder pain 11/24/2014  . History of PCOS 06/06/2014  . Type 2 diabetes mellitus without complication, with long-term current use of insulin (HCC) 06/06/2014    06/08/2014, PT, DPT, OCS, ATC 09/29/20  8:30 AM    [facility] Physical Therapy [address] [place], [place], [place] Phone: [phone]   Fax:  [phone]  Name: Genesis Novosad MRN: Damita Lack Date of Birth: 1966/07/16

## 2020-10-02 ENCOUNTER — Ambulatory Visit (INDEPENDENT_AMBULATORY_CARE_PROVIDER_SITE_OTHER): Payer: 59 | Admitting: Physical Therapy

## 2020-10-02 ENCOUNTER — Encounter: Payer: Self-pay | Admitting: Physical Therapy

## 2020-10-02 ENCOUNTER — Other Ambulatory Visit: Payer: Self-pay

## 2020-10-02 DIAGNOSIS — M6281 Muscle weakness (generalized): Secondary | ICD-10-CM | POA: Diagnosis not present

## 2020-10-02 DIAGNOSIS — M25551 Pain in right hip: Secondary | ICD-10-CM

## 2020-10-02 DIAGNOSIS — M25552 Pain in left hip: Secondary | ICD-10-CM

## 2020-10-02 NOTE — Therapy (Addendum)
[facility] Physical Therapy [address] [place], [place], [address] Phone: [phone]   Fax:  [phone]  Physical Therapy Treatment  Patient Details  Name: Shelly Whitehead MRN: [id number] Date of Birth: 10-May-1966 Referring Provider (PT): Gershon Mussel MD   Encounter Date: 10/02/2020   PT End of Session - 10/02/20 1025    Visit Number 4    Number of Visits 12    Date for PT Re-Evaluation 11/03/20    Progress Note Due on Visit 10    PT Start Time 1015    PT Stop Time 1055    PT Time Calculation (min) 40 min    Activity Tolerance Patient tolerated treatment well    Behavior During Therapy [facility] for tasks assessed/performed           Past Medical History:  Diagnosis Date  . Adhesive capsulitis of right shoulder   . ASCUS of cervix with negative high risk HPV   . Diabetes mellitus without complication (HCC)   . Migraine with aura and with status migrainosus, not intractable   . Postmenopausal bleeding   . SVT (supraventricular tachycardia) (HCC)     Past Surgical History:  Procedure Laterality Date  . APPENDECTOMY    . GALLBLADDER SURGERY  1998  . SHOULDER SURGERY  2016    There were no vitals filed for this visit.   Subjective Assessment - 10/02/20 1020    Subjective Pt arriving to therapy reporting still having difficutly putting on her socks on the R LE. Pt reporting it's getting easier on her L LE.    Pertinent History DM2, gall bladder removal, appendectomy    Patient Stated Goals "I want to walk without pain"    Currently in Pain? No/denies                             [facility] Adult PT Treatment/Exercise - 10/02/20 0001      Knee/Hip Exercises: Stretches   Piriformis Stretch 30 seconds;3 reps;Both      Knee/Hip Exercises: Aerobic   Recumbent Bike L1.5 8 minutes    Nustep Lvl 6 11 mins      Knee/Hip Exercises: Supine   Bridges 2 sets;10 reps;Both      Knee/Hip Exercises: Sidelying   Clams clams 3 x10        Manual Therapy   Manual therapy comments compression to piriformis during clams shells, percussion to bilateral IT bands with manual trigger point release           Pt did not perform Nustep exercise: It was added by mistake.  Narda Amber, PT MPT 10/04/20 9:46 AM               PT Long Term Goals - 10/02/20 1046      PT LONG TERM GOAL #1   Title Pt will be independent in her HEP and progression.    Period Weeks    Status On-going      PT LONG TERM GOAL #2   Title Pt will be albe to donne/doff her socks with no pain reported.    Status On-going      PT LONG TERM GOAL #3   Title Pt will be able to stand after sitting for 30 minutes with no pain reported.    Status On-going      PT LONG TERM GOAL #4   Title Pt will improve her bilateral hip strength to grossly 4+/5 in order to improve  functional mobility and gait.    Status On-going                 Plan - 10/02/20 1025    Clinical Impression Statement Pt tolerating all exericises well. Pt reporting mild soreness in R hip after using recumbant bike. Pt's goal is to be able to donn/doff her socks without pain. Still progressing towrad LTG's set improving functional moiblity.    Personal Factors and Comorbidities Comorbidity 1    Comorbidities DM    Examination-Activity Limitations Dressing;Sit;Squat;Other    Stability/Clinical Decision Making Stable/Uncomplicated    Rehab Potential Good    PT Frequency 2x / week    PT Duration 6 weeks    PT Treatment/Interventions ADLs/Self Care Home Management;Electrical Stimulation;Iontophoresis 4mg /ml Dexamethasone;Moist Heat;Ultrasound;Gait training;Stair training;Functional mobility training;Therapeutic activities;Therapeutic exercise;Balance training;Neuromuscular re-education;Patient/family education;Passive range of motion;Manual techniques;Dry needling;Taping;Cryotherapy    PT Next Visit Plan Manual myofascial release techniques lateral hip, possible DN.  Continued  hip strengthening    PT Home Exercise Plan Access Code: [email or web address]    Consulted and Agree with Plan of Care Patient           Patient will benefit from skilled therapeutic intervention in order to improve the following deficits and impairments:  Postural dysfunction, Decreased strength, Impaired flexibility, Pain, Decreased range of motion, Difficulty walking  Visit Diagnosis: Pain in right hip  Pain in left hip  Muscle weakness (generalized)     Problem List Patient Active Problem List   Diagnosis Date Noted  . Bilateral hip pain 08/25/2020  . Postmenopausal bleeding 01/12/2020  . ASCUS of cervix with negative high risk HPV 10/11/2018  . SVT (supraventricular tachycardia) (HCC) 04/14/2018  . Chronic migraine 12/16/2017  . Atypical chest pain 10/01/2016  . Heart palpitations 10/01/2016  . Incomplete tear of right rotator cuff 01/26/2015  . Adhesive capsulitis of right shoulder 11/24/2014  . Biceps tendinitis of right shoulder 11/24/2014  . Right shoulder pain 11/24/2014  . History of PCOS 06/06/2014  . Type 2 diabetes mellitus without complication, with long-term current use of insulin (HCC) 06/06/2014    06/08/2014, PT, MPT 10/02/2020, 10:56 AM  [facility] Physical Therapy [address] [place], [place], [place] Phone: [phone]   Fax:  [phone]  Name: Brandon Scarbrough MRN: Damita Lack Date of Birth: [birth year]

## 2020-10-04 ENCOUNTER — Ambulatory Visit (INDEPENDENT_AMBULATORY_CARE_PROVIDER_SITE_OTHER): Payer: 59 | Admitting: Physical Therapy

## 2020-10-04 ENCOUNTER — Encounter: Payer: Self-pay | Admitting: Physical Therapy

## 2020-10-04 ENCOUNTER — Other Ambulatory Visit: Payer: Self-pay

## 2020-10-04 DIAGNOSIS — M6281 Muscle weakness (generalized): Secondary | ICD-10-CM | POA: Diagnosis not present

## 2020-10-04 DIAGNOSIS — M25551 Pain in right hip: Secondary | ICD-10-CM | POA: Diagnosis not present

## 2020-10-04 DIAGNOSIS — M25552 Pain in left hip: Secondary | ICD-10-CM | POA: Diagnosis not present

## 2020-10-04 NOTE — Therapy (Signed)
[facility] Physical Therapy [address] [place], [place], [address] Phone: [phone]   Fax:  [phone]  Physical Therapy Treatment  Patient Details  Name: Shelly Whitehead MRN: [id number] Date of Birth: 1966-11-02 Referring Provider (PT): Gershon Mussel MD   Encounter Date: 10/04/2020   PT End of Session - 10/04/20 0940    Visit Number 5    Number of Visits 12    Date for PT Re-Evaluation 11/03/20    Progress Note Due on Visit 10    PT Start Time 0932    PT Stop Time 1012    PT Time Calculation (min) 40 min    Activity Tolerance Patient tolerated treatment well    Behavior During Therapy [facility] for tasks assessed/performed           Past Medical History:  Diagnosis Date  . Adhesive capsulitis of right shoulder   . ASCUS of cervix with negative high risk HPV   . Diabetes mellitus without complication (HCC)   . Migraine with aura and with status migrainosus, not intractable   . Postmenopausal bleeding   . SVT (supraventricular tachycardia) (HCC)     Past Surgical History:  Procedure Laterality Date  . APPENDECTOMY    . GALLBLADDER SURGERY  1998  . SHOULDER SURGERY  2016    There were no vitals filed for this visit.   Subjective Assessment - 10/04/20 0936    Subjective Pt arriving to therpay reporting soreness from percussion last session. Pt still reporting pain of 2/10.    Pertinent History DM2, gall bladder removal, appendectomy    Patient Stated Goals "I want to walk without pain"    Currently in Pain? Yes    Pain Score 2     Pain Location Hip    Pain Orientation Right;Left    Pain Descriptors / Indicators Sore    Pain Type Acute pain    Pain Onset More than a month ago                             [facility] Adult PT Treatment/Exercise - 10/04/20 0001      Knee/Hip Exercises: Stretches   Piriformis Stretch 30 seconds;3 reps;Both    Piriformis Stretch Limitations sitting      Knee/Hip Exercises: Aerobic   Recumbent  Bike L3  8 minutes      Knee/Hip Exercises: Standing   Forward Step Up Right;Left;20 reps    Forward Step Up Limitations lateral step ups x 20 each LE     Other Standing Knee Exercises hip extension x 20 reps    Other Standing Knee Exercises balance board side to side: x 3 minutes, balance borad front/back 3 minutes   intermittent UE as needed.      Knee/Hip Exercises: Seated   Other Seated Knee/Hip Exercises mini squats x 15      Knee/Hip Exercises: Supine   Bridges 2 sets;10 reps;Both                       PT Long Term Goals - 10/02/20 1046      PT LONG TERM GOAL #1   Title Pt will be independent in her HEP and progression.    Period Weeks    Status On-going      PT LONG TERM GOAL #2   Title Pt will be albe to donne/doff her socks with no pain reported.    Status On-going  PT LONG TERM GOAL #3   Title Pt will be able to stand after sitting for 30 minutes with no pain reported.    Status On-going      PT LONG TERM GOAL #4   Title Pt will improve her bilateral hip strength to grossly 4+/5 in order to improve functional mobility and gait.    Status On-going                 Plan - 10/04/20 0940    Clinical Impression Statement Pt arriving to therapy reporting soreness from percussion last visit. Pt reported no bruising noted from last visit. Pt progressing with strengthening and functional moblity. Still progressing toward LTG's.    Personal Factors and Comorbidities Comorbidity 1    Comorbidities DM    Examination-Activity Limitations Dressing;Sit;Squat;Other    Examination-Participation Restrictions Other;Occupation    Stability/Clinical Decision Making Stable/Uncomplicated    Rehab Potential Good    PT Frequency 2x / week    PT Duration 6 weeks    PT Treatment/Interventions ADLs/Self Care Home Management;Electrical Stimulation;Iontophoresis 4mg /ml Dexamethasone;Moist Heat;Ultrasound;Gait training;Stair training;Functional mobility  training;Therapeutic activities;Therapeutic exercise;Balance training;Neuromuscular re-education;Patient/family education;Passive range of motion;Manual techniques;Dry needling;Taping;Cryotherapy    PT Next Visit Plan Manual myofascial release techniques lateral hip, possible DN.  Continued hip strengthening    PT Home Exercise Plan Access Code: [email or web address]    Consulted and Agree with Plan of Care Patient           Patient will benefit from skilled therapeutic intervention in order to improve the following deficits and impairments:  Postural dysfunction, Decreased strength, Impaired flexibility, Pain, Decreased range of motion, Difficulty walking  Visit Diagnosis: Pain in right hip  Pain in left hip  Muscle weakness (generalized)     Problem List Patient Active Problem List   Diagnosis Date Noted  . Bilateral hip pain 08/25/2020  . Postmenopausal bleeding 01/12/2020  . ASCUS of cervix with negative high risk HPV 10/11/2018  . SVT (supraventricular tachycardia) (HCC) 04/14/2018  . Chronic migraine 12/16/2017  . Atypical chest pain 10/01/2016  . Heart palpitations 10/01/2016  . Incomplete tear of right rotator cuff 01/26/2015  . Adhesive capsulitis of right shoulder 11/24/2014  . Biceps tendinitis of right shoulder 11/24/2014  . Right shoulder pain 11/24/2014  . History of PCOS 06/06/2014  . Type 2 diabetes mellitus without complication, with long-term current use of insulin (HCC) 06/06/2014    06/08/2014, PT, MPT 10/04/2020, 10:13 AM  [facility] Physical Therapy [address] [place], [place], [place] Phone: [phone]   Fax:  [phone]  Name: Dondra Rhett MRN: Damita Lack Date of Birth: 1966/01/16

## 2020-10-09 ENCOUNTER — Encounter: Payer: 59 | Admitting: Physical Therapy

## 2020-10-10 ENCOUNTER — Other Ambulatory Visit: Payer: Self-pay | Admitting: Family

## 2020-10-11 ENCOUNTER — Other Ambulatory Visit: Payer: Self-pay

## 2020-10-11 ENCOUNTER — Ambulatory Visit (INDEPENDENT_AMBULATORY_CARE_PROVIDER_SITE_OTHER): Payer: 59 | Admitting: Physical Therapy

## 2020-10-11 ENCOUNTER — Encounter: Payer: Self-pay | Admitting: Physical Therapy

## 2020-10-11 DIAGNOSIS — M6281 Muscle weakness (generalized): Secondary | ICD-10-CM | POA: Diagnosis not present

## 2020-10-11 DIAGNOSIS — M25551 Pain in right hip: Secondary | ICD-10-CM | POA: Diagnosis not present

## 2020-10-11 DIAGNOSIS — M25552 Pain in left hip: Secondary | ICD-10-CM

## 2020-10-11 NOTE — Therapy (Signed)
[facility] Physical Therapy [address] [place], [place], [address] Phone: [phone]   Fax:  [phone]  Physical Therapy Treatment  Patient Details  Name: Shelly Whitehead MRN: [id number] Date of Birth: 14-Mar-1966 Referring Provider (PT): Gershon Mussel MD   Encounter Date: 10/11/2020   PT End of Session - 10/11/20 0918    Visit Number 6    Number of Visits 12    Date for PT Re-Evaluation 11/03/20    PT Start Time 0845    PT Stop Time 0925    PT Time Calculation (min) 40 min    Activity Tolerance Patient tolerated treatment well    Behavior During Therapy [facility] for tasks assessed/performed           Past Medical History:  Diagnosis Date  . Adhesive capsulitis of right shoulder   . ASCUS of cervix with negative high risk HPV   . Diabetes mellitus without complication (HCC)   . Migraine with aura and with status migrainosus, not intractable   . Postmenopausal bleeding   . SVT (supraventricular tachycardia) (HCC)     Past Surgical History:  Procedure Laterality Date  . APPENDECTOMY    . GALLBLADDER SURGERY  1998  . SHOULDER SURGERY  2016    There were no vitals filed for this visit.   Subjective Assessment - 10/11/20 0914    Subjective Pt arriving to therapy reporting incresaed hip pain after going to a cabin over the holiday. Pt reporting the bed was uncomfortable where she ended up having to buy over the counter pain meds.    Pertinent History DM2, gall bladder removal, appendectomy    Patient Stated Goals "I want to walk without pain"    Currently in Pain? Yes    Pain Score 2     Pain Location Hip    Pain Orientation Right;Left    Pain Descriptors / Indicators Sore    Pain Type Acute pain    Pain Onset More than a month ago                             [facility] Adult PT Treatment/Exercise - 10/11/20 0001      Knee/Hip Exercises: Stretches   Piriformis Stretch 30 seconds;3 reps;Both    Piriformis Stretch Limitations supine     Other Knee/Hip Stretches hip external rotation in siting and supine holding 20 seconds each x 3 reps each hip      Knee/Hip Exercises: Aerobic   Recumbent Bike L3 x 6 minutes      Knee/Hip Exercises: Seated   Other Seated Knee/Hip Exercises SLR x 10 bilatreal LE's       Knee/Hip Exercises: Supine   Bridges 2 sets;10 reps;Both    Bridges Limitations calves on red physioball      Knee/Hip Exercises: Sidelying   Hip ABduction Strengthening;Both;20 reps    Clams green theraband x 15 reps      Knee/Hip Exercises: Prone   Hip Extension Strengthening;Both;2 sets;10 reps                       PT Long Term Goals - 10/02/20 1046      PT LONG TERM GOAL #1   Title Pt will be independent in her HEP and progression.    Period Weeks    Status On-going      PT LONG TERM GOAL #2   Title Pt will be albe to donne/doff her socks  with no pain reported.    Status On-going      PT LONG TERM GOAL #3   Title Pt will be able to stand after sitting for 30 minutes with no pain reported.    Status On-going      PT LONG TERM GOAL #4   Title Pt will improve her bilateral hip strength to grossly 4+/5 in order to improve functional mobility and gait.    Status On-going                 Plan - 10/11/20 0920    Clinical Impression Statement Pt arriving to therapy reporting soreness in R hip > L hip. Pt was able to cross her R leg over her L without assistance. Progressing with ROM and strengthening. Still progressing towrad LTG's.    Personal Factors and Comorbidities Comorbidity 1    Comorbidities DM    Examination-Activity Limitations Dressing;Sit;Squat;Other    Examination-Participation Restrictions Other;Occupation    Rehab Potential Good    PT Frequency 2x / week    PT Treatment/Interventions ADLs/Self Care Home Management;Electrical Stimulation;Iontophoresis 4mg /ml Dexamethasone;Moist Heat;Ultrasound;Gait training;Stair training;Functional mobility training;Therapeutic  activities;Therapeutic exercise;Balance training;Neuromuscular re-education;Patient/family education;Passive range of motion;Manual techniques;Dry needling;Taping;Cryotherapy    PT Next Visit Plan Manual myofascial release techniques lateral hip, possible DN.  Continued hip strengthening    PT Home Exercise Plan Access Code: [email or web address]    Consulted and Agree with Plan of Care Patient           Patient will benefit from skilled therapeutic intervention in order to improve the following deficits and impairments:  Postural dysfunction, Decreased strength, Impaired flexibility, Pain, Decreased range of motion, Difficulty walking  Visit Diagnosis: Pain in right hip  Pain in left hip  Muscle weakness (generalized)     Problem List Patient Active Problem List   Diagnosis Date Noted  . Bilateral hip pain 08/25/2020  . Postmenopausal bleeding 01/12/2020  . ASCUS of cervix with negative high risk HPV 10/11/2018  . SVT (supraventricular tachycardia) (HCC) 04/14/2018  . Chronic migraine 12/16/2017  . Atypical chest pain 10/01/2016  . Heart palpitations 10/01/2016  . Incomplete tear of right rotator cuff 01/26/2015  . Adhesive capsulitis of right shoulder 11/24/2014  . Biceps tendinitis of right shoulder 11/24/2014  . Right shoulder pain 11/24/2014  . History of PCOS 06/06/2014  . Type 2 diabetes mellitus without complication, with long-term current use of insulin (HCC) 06/06/2014    06/08/2014 PT, MPT 10/11/2020, 11:48 AM  [facility] Physical Therapy [address] [place], [place], [place] Phone: [phone]   Fax:  [phone]  Name: Shelly Whitehead MRN: Damita Lack Date of Birth: 1965/12/03

## 2020-10-18 ENCOUNTER — Ambulatory Visit (INDEPENDENT_AMBULATORY_CARE_PROVIDER_SITE_OTHER): Payer: 59 | Admitting: Rehabilitative and Restorative Service Providers"

## 2020-10-18 ENCOUNTER — Encounter: Payer: Self-pay | Admitting: Rehabilitative and Restorative Service Providers"

## 2020-10-18 ENCOUNTER — Other Ambulatory Visit: Payer: Self-pay

## 2020-10-18 DIAGNOSIS — M6281 Muscle weakness (generalized): Secondary | ICD-10-CM

## 2020-10-18 DIAGNOSIS — M25552 Pain in left hip: Secondary | ICD-10-CM | POA: Diagnosis not present

## 2020-10-18 DIAGNOSIS — M25551 Pain in right hip: Secondary | ICD-10-CM | POA: Diagnosis not present

## 2020-10-18 NOTE — Therapy (Signed)
[facility] Physical Therapy [address] [place], [place], [address] Phone: [phone]   Fax:  [phone]  Physical Therapy Treatment  Patient Details  Name: Shelly Whitehead MRN: [id number] Date of Birth: 1966/09/03 Referring Provider (PT): Gershon Mussel MD   Encounter Date: 10/18/2020   PT End of Session - 10/18/20 0925    Visit Number 7    Number of Visits 12    Date for PT Re-Evaluation 11/03/20    Progress Note Due on Visit 10    PT Start Time 0923    PT Stop Time 1001    PT Time Calculation (min) 38 min    Activity Tolerance Patient tolerated treatment well    Behavior During Therapy [facility] for tasks assessed/performed           Past Medical History:  Diagnosis Date  . Adhesive capsulitis of right shoulder   . ASCUS of cervix with negative high risk HPV   . Diabetes mellitus without complication (HCC)   . Migraine with aura and with status migrainosus, not intractable   . Postmenopausal bleeding   . SVT (supraventricular tachycardia) (HCC)     Past Surgical History:  Procedure Laterality Date  . APPENDECTOMY    . GALLBLADDER SURGERY  1998  . SHOULDER SURGERY  2016    There were no vitals filed for this visit.   Subjective Assessment - 10/18/20 0929    Subjective Pt. indicated the Lt is doing better.  Pt. stated Rt hip hurting in bed and also with getting shoe/sock on.    Pertinent History DM2, gall bladder removal, appendectomy    Patient Stated Goals "I want to walk without pain"    Pain Score 1     Pain Location Hip    Pain Orientation Right    Pain Descriptors / Indicators Sore    Pain Onset More than a month ago    Pain Frequency Intermittent    Aggravating Factors  lying down/sleeping, shoe sock on/off                             [facility] Adult PT Treatment/Exercise - 10/18/20 0001      Knee/Hip Exercises: Stretches   Piriformis Stretch 30 seconds;3 reps;Right    Piriformis Stretch Limitations supine    Other  Knee/Hip Stretches supine groin stretch knee bent 20 sec x 3 Rt LE      Knee/Hip Exercises: Aerobic   Nustep Lvl 6 10 mins      Knee/Hip Exercises: Seated   Sit to Sand 15 reps;without UE support   18 inch table     Knee/Hip Exercises: Supine   Other Supine Knee/Hip Exercises supine faber x 10 Rt (heel up contralateral shin)      Knee/Hip Exercises: Sidelying   Clams 20x Rt    Other Sidelying Knee/Hip Exercises reverse clam 20x      Manual Therapy   Manual therapy comments compression to Rt glute med/min            Trigger Point Dry Needling - 10/18/20 0001    Consent Given? Yes    Education Handout Provided Yes    Muscles Treated Back/Hip Gluteus medius    Gluteus Medius Response Twitch response elicited                     PT Long Term Goals - 10/02/20 1046      PT LONG TERM GOAL #1  Title Pt will be independent in her HEP and progression.    Period Weeks    Status On-going      PT LONG TERM GOAL #2   Title Pt will be albe to donne/doff her socks with no pain reported.    Status On-going      PT LONG TERM GOAL #3   Title Pt will be able to stand after sitting for 30 minutes with no pain reported.    Status On-going      PT LONG TERM GOAL #4   Title Pt will improve her bilateral hip strength to grossly 4+/5 in order to improve functional mobility and gait.    Status On-going                 Plan - 10/18/20 5638    Clinical Impression Statement Concordant Rt hip symptoms noted from TrP in lateral hip.  Improved FABER movement in supine post manual intervention.  Continued focus on lateral hip pain improvement and improved Rt hip mobility.    Personal Factors and Comorbidities Comorbidity 1    Comorbidities DM    Examination-Activity Limitations Dressing;Sit;Squat;Other    Examination-Participation Restrictions Other;Occupation    Rehab Potential Good    PT Frequency 2x / week    PT Treatment/Interventions ADLs/Self Care Home  Management;Electrical Stimulation;Iontophoresis 4mg /ml Dexamethasone;Moist Heat;Ultrasound;Gait training;Stair training;Functional mobility training;Therapeutic activities;Therapeutic exercise;Balance training;Neuromuscular re-education;Patient/family education;Passive range of motion;Manual techniques;Dry needling;Taping;Cryotherapy    PT Next Visit Plan Manual myofascial release techniques lateral hip, possible DN.  Continued hip strengthening    PT Home Exercise Plan Access Code: [email or web address]    Consulted and Agree with Plan of Care Patient           Patient will benefit from skilled therapeutic intervention in order to improve the following deficits and impairments:  Postural dysfunction, Decreased strength, Impaired flexibility, Pain, Decreased range of motion, Difficulty walking  Visit Diagnosis: Pain in left hip  Pain in right hip  Muscle weakness (generalized)     Problem List Patient Active Problem List   Diagnosis Date Noted  . Bilateral hip pain 08/25/2020  . Postmenopausal bleeding 01/12/2020  . ASCUS of cervix with negative high risk HPV 10/11/2018  . SVT (supraventricular tachycardia) (HCC) 04/14/2018  . Chronic migraine 12/16/2017  . Atypical chest pain 10/01/2016  . Heart palpitations 10/01/2016  . Incomplete tear of right rotator cuff 01/26/2015  . Adhesive capsulitis of right shoulder 11/24/2014  . Biceps tendinitis of right shoulder 11/24/2014  . Right shoulder pain 11/24/2014  . History of PCOS 06/06/2014  . Type 2 diabetes mellitus without complication, with long-term current use of insulin (HCC) 06/06/2014    06/08/2014, PT, DPT, OCS, ATC 10/18/20  10:01 AM    [facility] Physical Therapy [address] [place], [place], [place] Phone: [phone]   Fax:  [phone]  Name: Lynora Dymond MRN: Damita Lack Date of Birth: 28-Nov-1965

## 2020-10-20 ENCOUNTER — Other Ambulatory Visit: Payer: Self-pay

## 2020-10-20 ENCOUNTER — Ambulatory Visit (INDEPENDENT_AMBULATORY_CARE_PROVIDER_SITE_OTHER): Payer: 59 | Admitting: Rehabilitative and Restorative Service Providers"

## 2020-10-20 ENCOUNTER — Encounter: Payer: Self-pay | Admitting: Rehabilitative and Restorative Service Providers"

## 2020-10-20 DIAGNOSIS — M25551 Pain in right hip: Secondary | ICD-10-CM | POA: Diagnosis not present

## 2020-10-20 DIAGNOSIS — M6281 Muscle weakness (generalized): Secondary | ICD-10-CM | POA: Diagnosis not present

## 2020-10-20 DIAGNOSIS — M25552 Pain in left hip: Secondary | ICD-10-CM

## 2020-10-20 NOTE — Therapy (Signed)
[facility] [address] [place], [place], [address] Phone: [phone]   Fax:  [phone]  Physical Therapy Treatment  Patient Details  Name: Shelly Whitehead MRN: [id number] Date of Birth: [birth year] Referring Provider (PT): Gershon Mussel MD   Encounter Date: 10/20/2020   PT End of Session - 10/20/20 1009    Visit Number 8    Number of Visits 12    Date for PT Re-Evaluation 11/03/20    Progress Note Due on Visit 10    PT Start Time 1009    PT Stop Time 1048    PT Time Calculation (min) 39 min    Activity Tolerance Patient tolerated treatment well    Behavior During Therapy [facility] for tasks assessed/performed           Past Medical History:  Diagnosis Date  . Adhesive capsulitis of right shoulder   . ASCUS of cervix with negative high risk HPV   . Diabetes mellitus without complication (HCC)   . Migraine with aura and with status migrainosus, not intractable   . Postmenopausal bleeding   . SVT (supraventricular tachycardia) (HCC)     Past Surgical History:  Procedure Laterality Date  . APPENDECTOMY    . GALLBLADDER SURGERY  1998  . SHOULDER SURGERY  2016    There were no vitals filed for this visit.   Subjective Assessment - 10/20/20 1011    Subjective Pt. indicated improvement in mobility of Rt hip for don/doff shoes but having 4/10 or so today upon arrival.    Pertinent History DM2, gall bladder removal, appendectomy    Patient Stated Goals "I want to walk without pain"    Currently in Pain? Yes    Pain Score 4     Pain Location Hip    Pain Orientation Right    Pain Descriptors / Indicators Sore    Pain Type Acute pain    Pain Onset More than a month ago    Pain Frequency Intermittent    Aggravating Factors  FABER movement for don/doff sock                             [facility] Adult PT Treatment/Exercise - 10/20/20 0001      Knee/Hip Exercises: Stretches   Piriformis Stretch 30 seconds;5 reps;Right    supine     Knee/Hip Exercises: Aerobic   Nustep Lvl 6 10 mins      Knee/Hip Exercises: Standing   Other Standing Knee Exercises standing hip abd into doorframe hold 5 sec x 10 bilateral      Knee/Hip Exercises: Supine   Single Leg Bridge 2 sets;10 reps;Both      Knee/Hip Exercises: Sidelying   Other Sidelying Knee/Hip Exercises Rt sidelying er against gravity 3 x 10 bilateral      Manual Therapy   Manual therapy comments compression to Rt glute med/min            Trigger Point Dry Needling - 10/20/20 0001    Consent Given? Yes    Education Handout Provided Previously provided    Muscles Treated Back/Hip Gluteus medius    Gluteus Medius Response Twitch response elicited                     PT Long Term Goals - 10/02/20 1046      PT LONG TERM GOAL #1   Title Pt will be independent in her HEP and progression.  Period Weeks    Status On-going      PT LONG TERM GOAL #2   Title Pt will be albe to donne/doff her socks with no pain reported.    Status On-going      PT LONG TERM GOAL #3   Title Pt will be able to stand after sitting for 30 minutes with no pain reported.    Status On-going      PT LONG TERM GOAL #4   Title Pt will improve her bilateral hip strength to grossly 4+/5 in order to improve functional mobility and gait.    Status On-going                 Plan - 10/20/20 1039    Clinical Impression Statement Positive improvement in FABER movement noted since last visitl.  Pt. may continue to benefit from intervention to continue gains in mobility and reduced symptoms.  ER weakness of Rt hip noted compared to Lt in new intervention.    Personal Factors and Comorbidities Comorbidity 1    Comorbidities DM    Examination-Activity Limitations Dressing;Sit;Squat;Other    Examination-Participation Restrictions Other;Occupation    Rehab Potential Good    PT Frequency 2x / week    PT Treatment/Interventions ADLs/Self Care Home Management;Electrical  Stimulation;Iontophoresis 4mg /ml Dexamethasone;Moist Heat;Ultrasound;Gait training;Stair training;Functional mobility training;Therapeutic activities;Therapeutic exercise;Balance training;Neuromuscular re-education;Patient/family education;Passive range of motion;Manual techniques;Dry needling;Taping;Cryotherapy    PT Next Visit Plan Manual myofascial release techniques lateral hip, possible DN.  Continued hip strengthening    PT Home Exercise Plan Access Code: [email or web address]    Consulted and Agree with Plan of Care Patient           Patient will benefit from skilled therapeutic intervention in order to improve the following deficits and impairments:  Postural dysfunction,Decreased strength,Impaired flexibility,Pain,Decreased range of motion,Difficulty walking  Visit Diagnosis: Pain in left hip  Pain in right hip  Muscle weakness (generalized)     Problem List Patient Active Problem List   Diagnosis Date Noted  . Bilateral hip pain 08/25/2020  . Postmenopausal bleeding 01/12/2020  . ASCUS of cervix with negative high risk HPV 10/11/2018  . SVT (supraventricular tachycardia) (HCC) 04/14/2018  . Chronic migraine 12/16/2017  . Atypical chest pain 10/01/2016  . Heart palpitations 10/01/2016  . Incomplete tear of right rotator cuff 01/26/2015  . Adhesive capsulitis of right shoulder 11/24/2014  . Biceps tendinitis of right shoulder 11/24/2014  . Right shoulder pain 11/24/2014  . History of PCOS 06/06/2014  . Type 2 diabetes mellitus without complication, with long-term current use of insulin (HCC) 06/06/2014    06/08/2014, PT, DPT, OCS, ATC 10/20/20  10:41 AM    [facility] Physical Therapy [address] [place], [place], [place] Phone: [phone]   Fax:  [phone]  Name: Shelly Whitehead MRN: Damita Lack Date of Birth: 06-Jan-1966

## 2020-10-24 ENCOUNTER — Ambulatory Visit (INDEPENDENT_AMBULATORY_CARE_PROVIDER_SITE_OTHER): Payer: 59 | Admitting: Physical Therapy

## 2020-10-24 ENCOUNTER — Encounter: Payer: Self-pay | Admitting: Physical Therapy

## 2020-10-24 ENCOUNTER — Other Ambulatory Visit: Payer: Self-pay

## 2020-10-24 ENCOUNTER — Telehealth: Payer: Self-pay | Admitting: Physical Therapy

## 2020-10-24 DIAGNOSIS — M25552 Pain in left hip: Secondary | ICD-10-CM

## 2020-10-24 DIAGNOSIS — M25551 Pain in right hip: Secondary | ICD-10-CM

## 2020-10-24 DIAGNOSIS — M6281 Muscle weakness (generalized): Secondary | ICD-10-CM | POA: Diagnosis not present

## 2020-10-24 NOTE — Therapy (Signed)
[facility] [place], [place], [address] Phone: [phone]   Fax:  [phone]  Physical Therapy Treatment Discharge   Patient Details  Name: Shelly Whitehead MRN: [id number] Date of Birth: 1965/11/27 Referring Provider (PT): Frankey Shown MD   Encounter Date: 10/24/2020   PT End of Session - 10/24/20 0924    Visit Number 9    Number of Visits 12    Date for PT Re-Evaluation 11/03/20    PT Start Time 0800    PT Stop Time 0840    PT Time Calculation (min) 40 min    Activity Tolerance Patient tolerated treatment well    Behavior During Therapy [facility] for tasks assessed/performed           Past Medical History:  Diagnosis Date  . Adhesive capsulitis of right shoulder   . ASCUS of cervix with negative high risk HPV   . Diabetes mellitus without complication ([place])   . Migraine with aura and with status migrainosus, not intractable   . Postmenopausal bleeding   . SVT (supraventricular tachycardia) (HCC)     Past Surgical History:  Procedure Laterality Date  . APPENDECTOMY    . GALLBLADDER SURGERY  1998  . SHOULDER SURGERY  2016    There were no vitals filed for this visit.   Subjective Assessment - 10/24/20 0810    Subjective Pt arriving to therapy reporting feeling much better. Feels like today may be her last visit. Pt reporting being able to get her shoes on now with 4/10 pain on R and 2/10 pain on left.    Pertinent History DM2, gall bladder removal, appendectomy    Patient Stated Goals "I want to walk without pain"    Currently in Pain? Yes    Pain Score 4     Pain Location Hip   2/10 pain on left   Pain Orientation Right    Pain Descriptors / Indicators Sore    Pain Type Acute pain    Pain Onset More than a month ago    Multiple Pain Sites Yes              [facility] PT Assessment - 10/24/20 0001      Assessment   Medical Diagnosis bilateral hip pain: M25.552    Referring Provider (PT) Frankey Shown MD    Hand  Dominance Right    Prior Therapy Yes, R shoulder 2016      Precautions   Precautions None      Cognition   Overall Cognitive Status Within Functional Limits for tasks assessed      Observation/Other Assessments   Focus on Therapeutic Outcomes (FOTO)  27% limitation (73% functional)   predicted 75% functional     AROM   AROM Assessment Site Hip    Right/Left Hip Right;Left      Strength   Right Hip Flexion 5/5    Right Hip Extension 5/5    Right Hip External Rotation  5/5    Right Hip Internal Rotation 5/5    Right Hip ABduction 5/5    Right Hip ADduction 5/5    Left Hip Flexion 5/5    Left Hip Extension 5/5    Left Hip External Rotation 5/5    Left Hip Internal Rotation 5/5    Left Hip ABduction 5/5    Left Hip ADduction 5/5    Right Knee Flexion 5/5    Right Knee Extension 5/5    Left Knee Flexion 5/5  Left Knee Extension 5/5                         [facility] Adult PT Treatment/Exercise - 10/24/20 0001      Knee/Hip Exercises: Stretches   Piriformis Stretch 30 seconds;5 reps;Right   supine     Knee/Hip Exercises: Aerobic   Nustep Lvl 6 10 mins      Knee/Hip Exercises: Standing   Other Standing Knee Exercises standing hip abd into doorframe hold 5 sec x 10 bilateral      Knee/Hip Exercises: Supine   Single Leg Bridge 2 sets;10 reps;Both      Knee/Hip Exercises: Sidelying   Other Sidelying Knee/Hip Exercises Rt sidelying er against gravity 3 x 10 bilateral                       PT Long Term Goals - 10/24/20 9622      PT LONG TERM GOAL #1   Title Pt will be independent in her HEP and progression.    Status Achieved      PT LONG TERM GOAL #2   Title Pt will be albe to donne/doff her socks with no pain reported.    Baseline Pt still reporting pain while performing activity but pain is </= 4/10.    Status Partially Met      PT LONG TERM GOAL #3   Title Pt will be able to stand after sitting for 30 minutes with no pain reported.     Status Achieved      PT LONG TERM GOAL #4   Title Pt will improve her bilateral hip strength to grossly 4+/5 in order to improve functional mobility and gait.    Baseline grossly 5/5 bilateral hips    Status Achieved                 Plan - 10/24/20 0814    Clinical Impression Statement Pt arriving today reporting feeling much better and feels today may be her last day. Pt has met 3/4 of her LTG's. Still progressing toward being able to donn/doff socks and shoes with no pain. Pt's HEP was updated, pt was issued new theraband and handout. No further skilled PT needed at present time.    Personal Factors and Comorbidities Comorbidity 1    Comorbidities DM    Examination-Activity Limitations Dressing;Sit;Squat;Other    Examination-Participation Restrictions Other;Occupation    Stability/Clinical Decision Making Stable/Uncomplicated    Rehab Potential Good    PT Frequency 2x / week    PT Duration 6 weeks    PT Treatment/Interventions ADLs/Self Care Home Management;Electrical Stimulation;Iontophoresis 46m/ml Dexamethasone;Moist Heat;Ultrasound;Gait training;Stair training;Functional mobility training;Therapeutic activities;Therapeutic exercise;Balance training;Neuromuscular re-education;Patient/family education;Passive range of motion;Manual techniques;Dry needling;Taping;Cryotherapy    PT Next Visit Plan Pt discharged from skilled PT at present time.    PT Home Exercise Plan Access Code: [email or web address]   Consulted and Agree with Plan of Care Patient           Patient will benefit from skilled therapeutic intervention in order to improve the following deficits and impairments:  Postural dysfunction,Decreased strength,Impaired flexibility,Pain,Decreased range of motion,Difficulty walking  Visit Diagnosis: Pain in left hip  Pain in right hip  Muscle weakness (generalized)    PHYSICAL THERAPY DISCHARGE SUMMARY  Visits from Start of Care: 9  Current functional level related to  goals / functional outcomes: See above    Remaining deficits: See above   Education / Equipment: See  above Plan: Patient agrees to discharge.  Patient goals were partially met. Patient is being discharged due to being pleased with the current functional level.  ?????       Problem List Patient Active Problem List   Diagnosis Date Noted  . Bilateral hip pain 08/25/2020  . Postmenopausal bleeding 01/12/2020  . ASCUS of cervix with negative high risk HPV 10/11/2018  . SVT (supraventricular tachycardia) ([place]) 04/14/2018  . Chronic migraine 12/16/2017  . Atypical chest pain 10/01/2016  . Heart palpitations 10/01/2016  . Incomplete tear of right rotator cuff 01/26/2015  . Adhesive capsulitis of right shoulder 11/24/2014  . Biceps tendinitis of right shoulder 11/24/2014  . Right shoulder pain 11/24/2014  . History of PCOS 06/06/2014  . Type 2 diabetes mellitus without complication, with long-term current use of insulin ([place]) 06/06/2014    Oretha Caprice, PT, MPT 10/24/2020, 9:24 AM  [facility] Physical Therapy [address] [place], [place], [address] Phone: [phone]   Fax:  [phone]  Name: Shelly Whitehead MRN: [id number] Date of Birth: 1966/08/22

## 2020-10-24 NOTE — Telephone Encounter (Signed)
Called pt to follow up with FOTO survery for PT discharge.   Narda Amber, PT 10/24/20 9:17 AM

## 2020-10-24 NOTE — Patient Instructions (Signed)
Access Code: [email or web address] URL: [email or web address] Date: 10/24/2020 Prepared by: Narda Amber  Exercises Supine Bridge - 1 x daily - 7 x weekly - 2 sets - 10 reps Alternating Single Leg Bridge - 1 x daily - 7 x weekly - 2 sets - 10 reps - 5-10 seconds hold Single Leg Bridge - 1 x daily - 7 x weekly - 2 sets - 10 reps - 5-10 seconds hold Figure 4 Bridge - 1 x daily - 7 x weekly - 2 sets - 10 reps - 5-10 seconds hold Hooklying Hamstring Stretch with Strap - 2 x daily - 7 x weekly - 3-5 reps - 20-30 seconds hold Supine Hip Adduction Isometric with Ball - 2 x daily - 7 x weekly - 2 sets - 10 reps - 3-5 seconds hold Hooklying Clamshell with Resistance - 2 x daily - 7 x weekly - 2 sets - 10 reps - 3 seconds hold Supine Piriformis Stretch with Foot on Ground - 2 x daily - 7 x weekly - 3-5 reps - 20-30 seconds hold Standing Hip Hiking - 1 x daily - 7 x weekly - 3 sets - 10 reps Sidelying Hip Abduction - 1 x daily - 7 x weekly - 3 sets - 10 reps

## 2020-10-26 ENCOUNTER — Encounter: Payer: 59 | Admitting: Rehabilitative and Restorative Service Providers"

## 2020-10-27 ENCOUNTER — Encounter: Payer: 59 | Admitting: Rehabilitative and Restorative Service Providers"

## 2020-10-31 ENCOUNTER — Encounter: Payer: 59 | Admitting: Physical Therapy

## 2020-11-02 ENCOUNTER — Encounter: Payer: 59 | Admitting: Rehabilitative and Restorative Service Providers"

## 2020-11-08 ENCOUNTER — Other Ambulatory Visit: Payer: Self-pay | Admitting: Cardiology

## 2020-11-13 NOTE — Progress Notes (Deleted)
Cardiology Office Note:    Date:  11/13/2020   ID:  Shelly Whitehead, DOB 06-10-1966, MRN [id number]  PCP:  Shelly Hughs, NP  Cardiologist:  No primary care provider on file.  Electrophysiologist:  None   Referring MD: Ngetich, Nelda Bucks, NP   No chief complaint on file.   History of Present Illness:    Shelly Whitehead is a 55 y.o. female with a hx of diabetes, hypertension, possible SVT who presents for follow-up.  She was referred by Shelly Sax, NP for evaluation of palpitations, initially seen on 02/10/2020.  She previously was followed at [facility] for cardiology.  She was taking Toprol-XL for palpitations.  She had a nuclear stress test on 11/08/2016, which was normal.  TTE on 10/08/2016 showed normal LV systolic function, mild diastolic dysfunction, mild AI.  Holter monitor on 09/05/2017 showed no significant arrhythmias.  She had an another Holter on 10/22/2016 which also showed no arrhythmias..  She was empirically started on beta-blocker for presumed SVT.  She had been taking Toprol-XL 100 mg daily, but recently moved to [facility] and tapered off her metoprolol over the course of 1 month.  States that once off the metoprolol she began having palpitations again.  Reports that she had no palpitations when on metoprolol 100 mg daily.  During palpitations, she reports she feels like her heart is pounding and will feel nauseous.  Last for 10 to 15 minutes.  No lightheadedness, syncope, dyspnea, or chest pain.  She saw her PCP and has been restarted on metoprolol 50 mg daily, with improvement in symptoms.  She reports that she drinks 2 cups of coffee per day.  She previously tried stopping caffeine and did not help with her palpitations.  Drinks 1-2 beers per week.  No smoking history.  Father died of MI in 21s.  She walks for 30 minutes 3-4 times per week, and denies any exertional symptoms.  Echocardiogram on 02/23/2020 showed normal biventricular function, no significant valvular disease.  Zio  patch x14 days on 09/13/2020 showed no significant arrhythmias.  Since last clinic visit,   she reports that she has been doing well. She does state that she continues to have palpitations, occurring a few times per week, lasting for 5 to 10 minutes. Feels like heart is pounding out of her chest during the episodes. Occur at rest. She denies any lightheadedness, syncope, chest pain, dyspnea. Reports she is walking for 30 minutes 2-3 times per week, denies any exertional symptoms.      Past Medical History:  Diagnosis Date  . Adhesive capsulitis of right shoulder   . ASCUS of cervix with negative high risk HPV   . Diabetes mellitus without complication ([place])   . Migraine with aura and with status migrainosus, not intractable   . Postmenopausal bleeding   . SVT (supraventricular tachycardia) (HCC)     Past Surgical History:  Procedure Laterality Date  . APPENDECTOMY    . GALLBLADDER SURGERY  1998  . SHOULDER SURGERY  2016    Current Medications: No outpatient medications have been marked as taking for the 11/17/20 encounter (Appointment) with Donato Heinz, MD.     Allergies:   Metformin, Oxycodone, and Oxycodone-acetaminophen   Social History   Socioeconomic History  . Marital status: Single    Spouse name: Not on file  . Number of children: Not on file  . Years of education: Not on file  . Highest education level: Not on file  Occupational History  . Occupation:  attorney  Tobacco Use  . Smoking status: Never Smoker  . Smokeless tobacco: Never Used  Vaping Use  . Vaping Use: Never used  Substance and Sexual Activity  . Alcohol use: Yes    Alcohol/week: 2.0 - 3.0 standard drinks    Types: 2 - 3 Cans of beer per week  . Drug use: Never  . Sexual activity: Yes    Partners: Female    Comment: 1st intercourse 55yo  Other Topics Concern  . Not on file  Social History Narrative   Tobacco use, amount per day now: N/A   Past tobacco use, amount per day:N/A    How many years did you use tobacco:N/A   Alcohol use (drinks per week):1   Diet:   Do you drink/eat things with caffeine: YES   Marital status:     SINGLE                             What year were you married?   Do you live in a house, apartment, assisted living, condo, trailer, etc.? HOUSE   Is it one or more stories? YES   How many persons live in your home? ME   Do you have pets in your home?( please list) 2 CATS   Current or past profession: ATTORNEY   Do you exercise?              NO                    Type and how often?   Do you have a living will? YES   Do you have a DNR form?                                   If not, do you want to discuss one? YES   Do you have signed POA/HPOA forms?    YES                     If so, please bring to you appointment   Social Determinants of Health   Financial Resource Strain: Not on file  Food Insecurity: Not on file  Transportation Needs: Not on file  Physical Activity: Not on file  Stress: Not on file  Social Connections: Not on file     Family History: The patient's family history includes Alzheimer's disease in her mother; Cancer in her father.  ROS:   Please see the history of present illness.     All other systems reviewed and are negative.  EKGs/Labs/Other Studies Reviewed:    The following studies were reviewed today:   EKG:  EKG is  ordered today.  The ekg ordered today demonstrates normal sinus rhythm, rate 59, no ST abnormalities  Recent Labs: 08/14/2020: ALT 11; BUN 20; Creat 1.02; Hemoglobin 14.3; Platelets 241; Potassium 4.3; Sodium 142; TSH 1.68  Recent Lipid Panel    Component Value Date/Time   CHOL 170 08/14/2020 0958   TRIG 132 08/14/2020 0958   HDL 44 (L) 08/14/2020 0958   CHOLHDL 3.9 08/14/2020 0958   LDLCALC 103 (H) 08/14/2020 0958    Physical Exam:    VS:  There were no vitals taken for this visit.    Wt Readings from Last 3 Encounters:  08/25/20 232 lb (105.2 kg)  08/18/20 230 lb 9.6 oz (104.6  kg)  08/17/20 232 lb 3.2 oz (105.3 kg)     DXA:JOIN nourished, well developed in no acute distress HEENT: Normal NECK: No JVD; No carotid bruits LYMPHATICS: No lymphadenopathy CARDIAC: RRR, no murmurs, rubs, gallops RESPIRATORY:  Clear to auscultation without rales, wheezing or rhonchi  ABDOMEN: Soft, non-tender, non-distended MUSCULOSKELETAL:  No edema; No deformity  SKIN: Warm and dry NEUROLOGIC:  Alert and oriented x 3 PSYCHIATRIC:  Normal affect   ASSESSMENT:    No diagnosis found. PLAN:    Palpitations: Negative Holter monitor x2 during work-up at [facility] in 2017/2019, but was started on Toprol-XL for empiric treatment of suspected SVT.  Symptoms improved with metoprolol.  She has continued to have palpitations,  Zio patch x14 days on 09/13/2020 showed no significant arrhythmias with triggered events corresponding to sinus rhythm.  Abnormal EKG: Inferior Q waves on prior EKG.  Echocardiogram unremarkable  Aortic regurgitation: Mild on TTE in 2017, echo on 02/23/2020 showed trivial AI  RTC in ***  Medication Adjustments/Labs and Tests Ordered: Current medicines are reviewed at length with the patient today.  Concerns regarding medicines are outlined above.  No orders of the defined types were placed in this encounter.  No orders of the defined types were placed in this encounter.   There are no Patient Instructions on file for this visit.   Signed, Little Ishikawa, MD  11/13/2020 8:30 AM    [facility]

## 2020-11-17 ENCOUNTER — Ambulatory Visit: Payer: 59 | Admitting: Cardiology

## 2020-12-05 ENCOUNTER — Other Ambulatory Visit: Payer: Self-pay | Admitting: Family

## 2020-12-05 DIAGNOSIS — E119 Type 2 diabetes mellitus without complications: Secondary | ICD-10-CM

## 2020-12-05 DIAGNOSIS — Z794 Long term (current) use of insulin: Secondary | ICD-10-CM

## 2021-01-22 MED ORDER — FIFTY50 PEN NEEDLES 31G X 8 MM MISC: 1 refills | Status: DC

## 2021-01-29 ENCOUNTER — Other Ambulatory Visit: Payer: Self-pay | Admitting: Family

## 2021-02-08 ENCOUNTER — Other Ambulatory Visit: Payer: Self-pay | Admitting: Obstetrics & Gynecology

## 2021-02-09 ENCOUNTER — Other Ambulatory Visit: Payer: Self-pay | Admitting: Family

## 2021-02-09 DIAGNOSIS — B379 Candidiasis, unspecified: Secondary | ICD-10-CM

## 2021-02-09 MED ORDER — FLUCONAZOLE 150 MG PO TABS: 150.0000 mg | ORAL_TABLET | ORAL | 4 refills | Status: DC

## 2021-02-11 ENCOUNTER — Other Ambulatory Visit: Payer: Self-pay | Admitting: Family

## 2021-02-12 ENCOUNTER — Other Ambulatory Visit: Payer: Self-pay | Admitting: Family

## 2021-02-12 ENCOUNTER — Other Ambulatory Visit: Payer: 59

## 2021-02-12 DIAGNOSIS — Z1231 Encounter for screening mammogram for malignant neoplasm of breast: Secondary | ICD-10-CM

## 2021-02-16 ENCOUNTER — Ambulatory Visit: Payer: 59 | Admitting: Family

## 2021-03-20 ENCOUNTER — Other Ambulatory Visit: Payer: 59

## 2021-03-23 ENCOUNTER — Ambulatory Visit: Payer: 59 | Admitting: Family

## 2021-03-29 ENCOUNTER — Ambulatory Visit
Admission: RE | Admit: 2021-03-29 | Discharge: 2021-03-29 | Disposition: A | Discharge status: Home / Self Care | Payer: 59 | Source: Ambulatory Visit

## 2021-03-29 ENCOUNTER — Other Ambulatory Visit: Payer: Self-pay

## 2021-03-29 DIAGNOSIS — Z1231 Encounter for screening mammogram for malignant neoplasm of breast: Secondary | ICD-10-CM

## 2021-04-07 ENCOUNTER — Other Ambulatory Visit: Payer: Self-pay | Admitting: Family

## 2021-04-13 ENCOUNTER — Other Ambulatory Visit: Payer: Self-pay | Admitting: Family

## 2021-04-13 DIAGNOSIS — E119 Type 2 diabetes mellitus without complications: Secondary | ICD-10-CM

## 2021-04-30 ENCOUNTER — Other Ambulatory Visit: Payer: Self-pay

## 2021-04-30 ENCOUNTER — Other Ambulatory Visit: Payer: 59

## 2021-04-30 DIAGNOSIS — E785 Hyperlipidemia, unspecified: Secondary | ICD-10-CM

## 2021-04-30 DIAGNOSIS — Z1159 Encounter for screening for other viral diseases: Secondary | ICD-10-CM

## 2021-04-30 DIAGNOSIS — E119 Type 2 diabetes mellitus without complications: Secondary | ICD-10-CM

## 2021-05-01 LAB — COMPLETE METABOLIC PANEL WITH GFR
AG Ratio: 1.4 (calc) (ref 1.0–2.5)
ALT: 16 U/L (ref 6–29)
AST: 16 U/L (ref 10–35)
Albumin: 4.1 g/dL (ref 3.6–5.1)
Alkaline phosphatase (APISO): 111 U/L (ref 37–153)
BUN/Creatinine Ratio: 14 (calc) (ref 6–22)
BUN: 15 mg/dL (ref 7–25)
CO2: 27 mmol/L (ref 20–32)
Calcium: 9 mg/dL (ref 8.6–10.4)
Chloride: 107 mmol/L (ref 98–110)
Creat: 1.06 mg/dL — ABNORMAL HIGH (ref 0.50–1.05)
GFR, Est African American: 68 mL/min/{1.73_m2} (ref >=60)
GFR, Est Non African American: 59 mL/min/{1.73_m2} — ABNORMAL LOW (ref >=60)
Globulin: 3 g/dL (calc) (ref 1.9–3.7)
Glucose, Bld: 149 mg/dL — ABNORMAL HIGH (ref 65–99)
Potassium: 3.9 mmol/L (ref 3.5–5.3)
Sodium: 142 mmol/L (ref 135–146)
Total Bilirubin: 0.5 mg/dL (ref 0.2–1.2)
Total Protein: 7.1 g/dL (ref 6.1–8.1)

## 2021-05-01 LAB — CBC WITH DIFFERENTIAL/PLATELET
Absolute Monocytes: 340 cells/uL (ref 200–950)
Basophils Absolute: 51 cells/uL (ref 0–200)
Basophils Relative: 1.1 %
Eosinophils Absolute: 138 cells/uL (ref 15–500)
Eosinophils Relative: 3 %
HCT: 45.7 % — ABNORMAL HIGH (ref 35.0–45.0)
Hemoglobin: 15.2 g/dL (ref 11.7–15.5)
Lymphs Abs: 1518 cells/uL (ref 850–3900)
MCH: 30.6 pg (ref 27.0–33.0)
MCHC: 33.3 g/dL (ref 32.0–36.0)
MCV: 92.1 fL (ref 80.0–100.0)
MPV: 10.5 fL (ref 7.5–12.5)
Monocytes Relative: 7.4 %
Neutro Abs: 2553 cells/uL (ref 1500–7800)
Neutrophils Relative %: 55.5 %
Platelets: 232 10*3/uL (ref 140–400)
RBC: 4.96 10*6/uL (ref 3.80–5.10)
RDW: 12 % (ref 11.0–15.0)
Total Lymphocyte: 33 %
WBC: 4.6 10*3/uL (ref 3.8–10.8)

## 2021-05-01 LAB — HEMOGLOBIN A1C
Hgb A1c MFr Bld: 7.2 % of total Hgb — ABNORMAL HIGH (ref <5.7)
Mean Plasma Glucose: 160 mg/dL
eAG (mmol/L): 8.9 mmol/L

## 2021-05-01 LAB — LIPID PANEL
Cholesterol: 130 mg/dL (ref <200)
HDL: 48 mg/dL — ABNORMAL LOW (ref >=50)
LDL Cholesterol (Calc): 62 mg/dL (calc)
Non-HDL Cholesterol (Calc): 82 mg/dL (calc) (ref <130)
Total CHOL/HDL Ratio: 2.7 (calc) (ref <5.0)
Triglycerides: 114 mg/dL (ref <150)

## 2021-05-01 LAB — TSH: TSH: 2.5 mIU/L

## 2021-05-01 LAB — HEPATITIS C ANTIBODY
Hepatitis C Ab: NONREACTIVE
SIGNAL TO CUT-OFF: 0.01 (ref <1.00)

## 2021-05-03 ENCOUNTER — Encounter: Payer: Self-pay | Admitting: Family

## 2021-05-03 ENCOUNTER — Other Ambulatory Visit: Payer: Self-pay

## 2021-05-03 ENCOUNTER — Ambulatory Visit (INDEPENDENT_AMBULATORY_CARE_PROVIDER_SITE_OTHER): Payer: 59 | Admitting: Family

## 2021-05-03 VITALS — BP 126/80 | HR 73 | Temp 96.9°F | Resp 16 | Ht 66.0 in | Wt 234.6 lb

## 2021-05-03 DIAGNOSIS — Z6836 Body mass index (BMI) 36.0-36.9, adult: Secondary | ICD-10-CM

## 2021-05-03 DIAGNOSIS — Z23 Encounter for immunization: Secondary | ICD-10-CM

## 2021-05-03 DIAGNOSIS — E785 Hyperlipidemia, unspecified: Secondary | ICD-10-CM | POA: Diagnosis not present

## 2021-05-03 DIAGNOSIS — Z794 Long term (current) use of insulin: Secondary | ICD-10-CM

## 2021-05-03 DIAGNOSIS — E119 Type 2 diabetes mellitus without complications: Secondary | ICD-10-CM | POA: Diagnosis not present

## 2021-05-03 NOTE — Progress Notes (Signed)
Provider: [name] FNP-C   Wiley Flicker, Nelda Bucks, NP  Patient Care Team: Sandrea Hughs, NP as PCP - General (Family Medicine)  Extended Emergency Contact Information Primary Emergency Contact: Alexis Frock Address: [address]          [place], [place] [address] Johnnette Litter of [place] Phone: [phone] Relation: Friend  Code Status:  Full Code  Goals of care: Advanced Directive information Advanced Directives 05/03/2021  Does Patient Have a Medical Advance Directive? No  Type of Advance Directive -  Does patient want to make changes to medical advance directive? -  Copy of [place] in Chart? -  Would patient like information on creating a medical advance directive? No - Patient declined  Pre-existing out of facility DNR order (yellow form or pink MOST form) -     Chief Complaint  Patient presents with   Medical Management of Chronic Issues    6 month follow up.   Health Maintenance    Discuss the need for Foot exam, and Eye exam.   Immunizations    Discuss the need for PNE vaccine.    HPI:  Pt is a 55 y.o. female seen today for 6 months follow up for medical management of chronic diseases. Has a medical history of type 2 DM,Migraine,hx of SVT among other conditions.she denies any acute issues today.  She was seen at [facility] ED 03/31/2021 for Marijuana intoxication after she ingested " edible" at a baseball game. She complained of chest pain but resolved.Troponin was normal.condition was stable.Zofran was prescribed for nausea and discharged home. She is due for PNA 23 vaccine.she agrees to receive her PNA vaccine today.  Also due for her annual eye exam.Advised to schedule appointment with her ophthalmology Home CBG readings in the 90's to low 200's.   Past Medical History:  Diagnosis Date   Adhesive capsulitis of right shoulder    ASCUS of cervix with negative high risk HPV    Diabetes mellitus  without complication (HCC)    Migraine with aura and with status migrainosus, not intractable    Postmenopausal bleeding    SVT (supraventricular tachycardia) (HCC)    Past Surgical History:  Procedure Laterality Date   [place]  2016    Allergies  Allergen Reactions   Metformin Diarrhea and Nausea Only   Oxycodone Itching   Oxycodone-Acetaminophen Itching    Allergies as of 05/03/2021       Reactions   Metformin Diarrhea, Nausea Only   Oxycodone Itching   Oxycodone-acetaminophen Itching        Medication List        Accurate as of May 03, 2021  9:24 AM. If you have any questions, ask your nurse or doctor.          aspirin 81 MG EC tablet Take 1 tablet by mouth daily.   atorvastatin 20 MG tablet Commonly known as: LIPITOR TAKE 1 TABLET BY MOUTH EVERY DAY   B-D ULTRAFINE III SHORT PEN 31G X 8 MM Misc Generic drug: Insulin Pen Needle USE TO INJECT INSULIN DAILY (INSURANCE MAX 30 DAYS, BUT ACTUALLY 90 DAYS) DX: E11.9   fluconazole 150 MG tablet Commonly known as: DIFLUCAN Take 1 tablet (150 mg total) by mouth every 14 (fourteen) days.   FreeStyle Libre 14 Day Reader Kerrin Mo Use to test blood sugar three times daily. Dx: E11.9   FreeStyle Libre 14 Day  Sensor Misc USE TO TEST BLOOD SUGAR THREE TIMES DAILY. DX E11.9   Jardiance 10 MG Tabs tablet Generic drug: empagliflozin TAKE 1 TABLET BY MOUTH EVERY DAY   Levemir FlexTouch 100 UNIT/ML FlexPen Generic drug: insulin detemir INJECT 30 UNITS INTO THE SKIN DAILY.   losartan 25 MG tablet Commonly known as: COZAAR TAKE 1 TABLET BY MOUTH EVERY DAY   metoprolol succinate 50 MG 24 hr tablet Commonly known as: TOPROL-XL TAKE 1 TABLET BY MOUTH EVERY DAY        Review of Systems  Constitutional:  Negative for appetite change, chills, fatigue, fever and unexpected weight change.  HENT:  Negative for congestion, dental problem, ear discharge, ear pain,  facial swelling, hearing loss, nosebleeds, postnasal drip, rhinorrhea, sinus pressure, sinus pain, sneezing, sore throat, tinnitus and trouble swallowing.   Eyes:  Positive for visual disturbance. Negative for pain, discharge, redness and itching.       Due for annual eye exam with Ophthalmologist   Respiratory:  Negative for cough, chest tightness, shortness of breath and wheezing.   Cardiovascular:  Negative for chest pain, palpitations and leg swelling.  Gastrointestinal:  Negative for abdominal distention, abdominal pain, blood in stool, constipation, diarrhea, nausea and vomiting.  Endocrine: Negative for cold intolerance, heat intolerance, polydipsia, polyphagia and polyuria.  Genitourinary:  Negative for difficulty urinating, dysuria, flank pain, frequency and urgency.  Musculoskeletal:  Positive for arthralgias. Negative for back pain, gait problem, joint swelling, myalgias, neck pain and neck stiffness.  Skin:  Negative for color change, pallor, rash and wound.  Neurological:  Positive for numbness. Negative for dizziness, syncope, speech difficulty, weakness, light-headedness and headaches.       Chronic numbness of fingers   Hematological:  Does not bruise/bleed easily.  Psychiatric/Behavioral:  Negative for agitation, behavioral problems, confusion, hallucinations, self-injury, sleep disturbance and suicidal ideas. The patient is not nervous/anxious.    Immunization History  Administered Date(s) Administered   Influenza,inj,Quad PF,6+ Mos 07/21/2015, 08/10/2016, 09/12/2017, 11/10/2018, 08/18/2020   Influenza-Unspecified 08/19/2014, 08/21/2019   Moderna Sars-Covid-2 Vaccination 01/18/2020, 02/15/2020, 09/15/2020, 02/08/2021   Pneumococcal Polysaccharide-23 07/21/2015   Tdap 06/21/2014   Zoster Recombinat (Shingrix) 07/14/2018, 10/13/2018   Pertinent  Health Maintenance Due  Topic Date Due   FOOT EXAM  Never done   OPHTHALMOLOGY EXAM  08/11/2020   INFLUENZA VACCINE  06/11/2021    HEMOGLOBIN A1C  10/30/2021   PAP SMEAR-Modifier  01/28/2023   MAMMOGRAM  03/30/2023   COLONOSCOPY (Pts 45-69yr Insurance coverage will need to be confirmed)  06/12/2028   Fall Risk  05/03/2021 08/18/2020 02/04/2020 01/14/2020  Falls in the past year? 0 0 0 0  Number falls in past yr: 0 0 0 0  Injury with Fall? 0 0 0 0  Risk for fall due to : No Fall Risks - - -   Functional Status Survey:    Vitals:   05/03/21 0852  BP: 126/80  Pulse: 73  Resp: 16  Temp: (!) 96.9 F (36.1 C)  SpO2: 97%  Weight: 234 lb 9.6 oz (106.4 kg)  Height: 5' 6"  (1.676 m)   Body mass index is 37.87 kg/m. Physical Exam Vitals reviewed.  Constitutional:      General: She is not in acute distress.    Appearance: Normal appearance. She is obese. She is not ill-appearing or diaphoretic.  HENT:     Head: Normocephalic.     Right Ear: Tympanic membrane, ear canal and external ear normal. There is no impacted cerumen.  Left Ear: Tympanic membrane, ear canal and external ear normal. There is no impacted cerumen.     Nose: Nose normal. No congestion or rhinorrhea.     Mouth/Throat:     Mouth: Mucous membranes are moist.     Pharynx: Oropharynx is clear. No oropharyngeal exudate or posterior oropharyngeal erythema.  Eyes:     General: No scleral icterus.       Right eye: No discharge.        Left eye: No discharge.     Extraocular Movements: Extraocular movements intact.     Conjunctiva/sclera: Conjunctivae normal.     Pupils: Pupils are equal, round, and reactive to light.  Neck:     Vascular: No carotid bruit.  Cardiovascular:     Rate and Rhythm: Normal rate and regular rhythm.     Pulses: Normal pulses.     Heart sounds: Normal heart sounds. No murmur heard.   No friction rub. No gallop.  Pulmonary:     Effort: Pulmonary effort is normal. No respiratory distress.     Breath sounds: Normal breath sounds. No wheezing, rhonchi or rales.  Chest:     Chest wall: No tenderness.  Abdominal:      General: Bowel sounds are normal. There is no distension.     Palpations: Abdomen is soft. There is no mass.     Tenderness: There is no abdominal tenderness. There is no right CVA tenderness, left CVA tenderness, guarding or rebound.  Musculoskeletal:        General: No swelling or tenderness. Normal range of motion.     Cervical back: Normal range of motion. No rigidity or tenderness.     Right lower leg: No edema.     Left lower leg: No edema.  Lymphadenopathy:     Cervical: No cervical adenopathy.  Skin:    General: Skin is warm and dry.     Coloration: Skin is not pale.     Findings: No bruising, erythema, lesion or rash.  Neurological:     Mental Status: She is alert and oriented to person, place, and time.     Cranial Nerves: No cranial nerve deficit.     Sensory: No sensory deficit.     Motor: No weakness.     Coordination: Coordination normal.     Gait: Gait normal.  Psychiatric:        Mood and Affect: Mood normal.        Speech: Speech normal.        Behavior: Behavior normal.        Thought Content: Thought content normal.        Judgment: Judgment normal.    Labs reviewed: Recent Labs    08/14/20 0958 04/30/21 0956  NA 142 142  K 4.3 3.9  CL 108 107  CO2 26 27  GLUCOSE 95 149*  BUN 20 15  CREATININE 1.02 1.06*  CALCIUM 9.3 9.0   Recent Labs    08/14/20 0958 04/30/21 0956  AST 15 16  ALT 11 16  BILITOT 0.5 0.5  PROT 6.6 7.1   Recent Labs    08/14/20 0958 04/30/21 0956  WBC 4.0 4.6  NEUTROABS 2,116 2,553  HGB 14.3 15.2  HCT 42.9 45.7*  MCV 92.9 92.1  PLT 241 232   Lab Results  Component Value Date   TSH 2.50 04/30/2021   Lab Results  Component Value Date   HGBA1C 7.2 (H) 04/30/2021   Lab Results  Component Value Date  CHOL 130 04/30/2021   HDL 48 (L) 04/30/2021   LDLCALC 62 04/30/2021   TRIG 114 04/30/2021   CHOLHDL 2.7 04/30/2021    Significant Diagnostic Results in last 30 days:  No results found.  Assessment/Plan 1.  Type 2 diabetes mellitus without complication, with long-term current use of insulin (HCC) CBG well controlled. - continue on Jardiance  and levemir  - On ASA and Statin  -continue on ARB  - CBC with Differential/Platelet; Future - CMP with eGFR([facility]); Future - TSH; Future - Hemoglobin A1c; Future  2. Hyperlipidemia LDL goal <100 Continue on atorvastatin and dietary modification and exercise - Lipid panel; Future  3. Need for pneumococcal vaccine Afebrile. PNA 23 administered by CMA today no reaction reported.  - Pneumococcal polysaccharide vaccine 23-valent greater than or equal to 2yo subcutaneous/IM  4. Class 2 severe obesity due to excess calories with serious comorbidity and body mass index (BMI) of 36.0 to 36.9 in adult (HCC) BMI 37.87  Dietary modification and Exercise discussed during visit.   Family/ staff Communication: Reviewed plan of care with patient verbalized understanding.  Labs/tests ordered:  - CBC with Differential/Platelet - CMP with eGFR([facility]) - TSH - Hgb A1C - Lipid panel  Next Appointment : 6 months for medical management of chronic issues.Fasting Labs prior to visit.    Sandrea Hughs, NP

## 2021-05-09 ENCOUNTER — Other Ambulatory Visit: Payer: Self-pay | Admitting: Adult Health

## 2021-05-09 DIAGNOSIS — Z794 Long term (current) use of insulin: Secondary | ICD-10-CM

## 2021-05-09 DIAGNOSIS — E119 Type 2 diabetes mellitus without complications: Secondary | ICD-10-CM

## 2021-05-13 DIAGNOSIS — Z6836 Body mass index (BMI) 36.0-36.9, adult: Secondary | ICD-10-CM | POA: Insufficient documentation

## 2021-05-24 MED ORDER — ATORVASTATIN CALCIUM 20 MG PO TABS: 20.0000 mg | ORAL_TABLET | Freq: Every day | ORAL | 1 refills | Status: DC

## 2021-08-06 ENCOUNTER — Other Ambulatory Visit: Payer: Self-pay | Admitting: Family

## 2021-08-08 ENCOUNTER — Other Ambulatory Visit: Payer: Self-pay | Admitting: Family

## 2021-09-08 ENCOUNTER — Other Ambulatory Visit: Payer: Self-pay | Admitting: Family

## 2021-09-17 MED ORDER — EMPAGLIFLOZIN 10 MG PO TABS: 10.0000 mg | ORAL_TABLET | Freq: Every day | ORAL | 1 refills | Status: DC

## 2021-09-17 NOTE — Telephone Encounter (Signed)
Refilled sent to Pharmacy.

## 2021-09-22 ENCOUNTER — Other Ambulatory Visit: Payer: Self-pay | Admitting: Family

## 2021-09-25 ENCOUNTER — Telehealth: Payer: Self-pay | Admitting: *Deleted

## 2021-09-25 NOTE — Telephone Encounter (Signed)
Patient stated that she needed a Prior Authorization for Jardiance.  Initiated through [employer].  PA Case ID: [id number] Key: Suezanne Jacquet into determination. Response in 48-72 hours.  Awaiting Determination.      Alternatives If Medication is Not Approved: metFORMIN HCl metFORMIN HCl ER glipiZIDE glipiZIDE ER Glimepiride glipiZIDE-metFORMIN HCl Pioglitazone HCl

## 2021-09-26 DIAGNOSIS — E119 Type 2 diabetes mellitus without complications: Secondary | ICD-10-CM

## 2021-10-02 NOTE — Telephone Encounter (Signed)
Prior Authorization Resubmitted for patient's Jardiance.  PA Case ID: [id number] Key: Haywood Filler into determination with response in 1-3 days.  Patient aware through MyChart.

## 2021-10-05 ENCOUNTER — Other Ambulatory Visit: Payer: Self-pay | Admitting: Cardiology

## 2021-10-08 ENCOUNTER — Telehealth: Payer: Self-pay

## 2021-10-08 NOTE — Telephone Encounter (Signed)
Incoming fax received from [place] ([employer]) stating patients Shelly Whitehead is approved 10/04/21-10/04/2022.  I called [facility] and left a message informing them to run patients rx again and to inform her when it is ready for pick up.

## 2021-10-23 LAB — HM DIABETES EYE EXAM

## 2021-10-29 IMAGING — CR DG HIP (WITH OR WITHOUT PELVIS) 3-4V BILAT
5 series · 5 of 5 positions shown · non-contrast
Comparison: None.

CLINICAL DATA: Bilateral hip pain

EXAM:
DG HIP (WITH OR WITHOUT PELVIS) 3-4V BILAT

[t pelvis a.p. *]
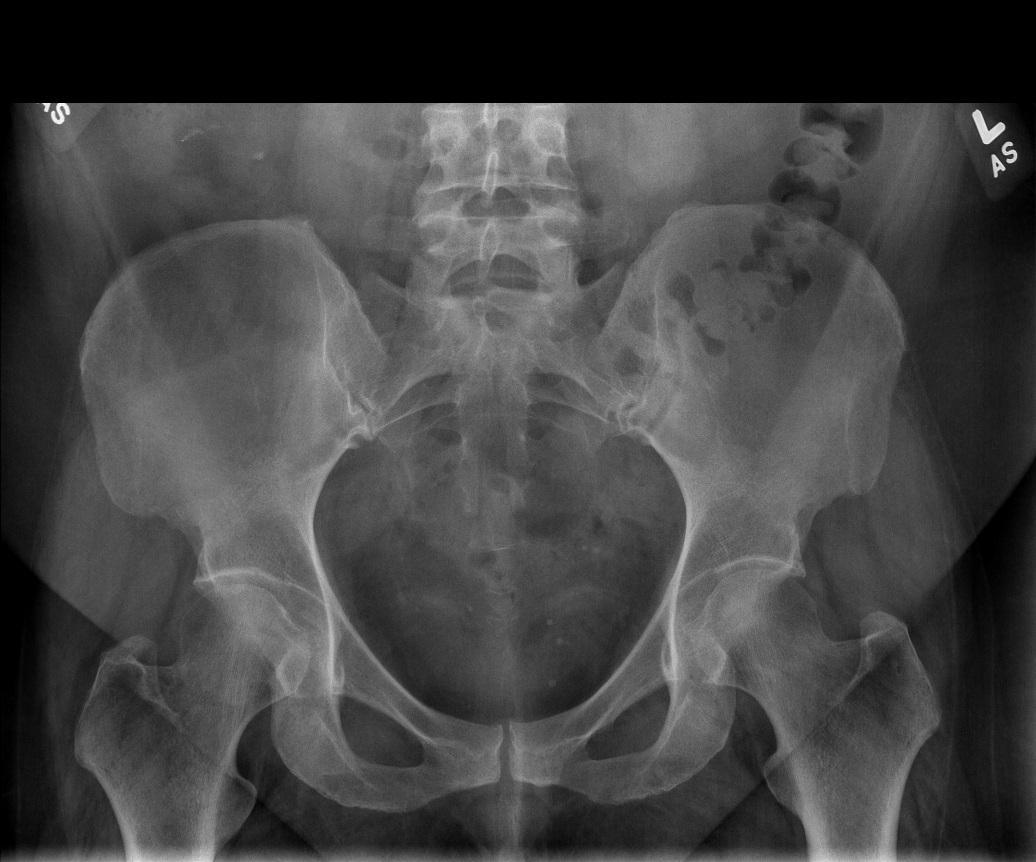

[t hip ap left]
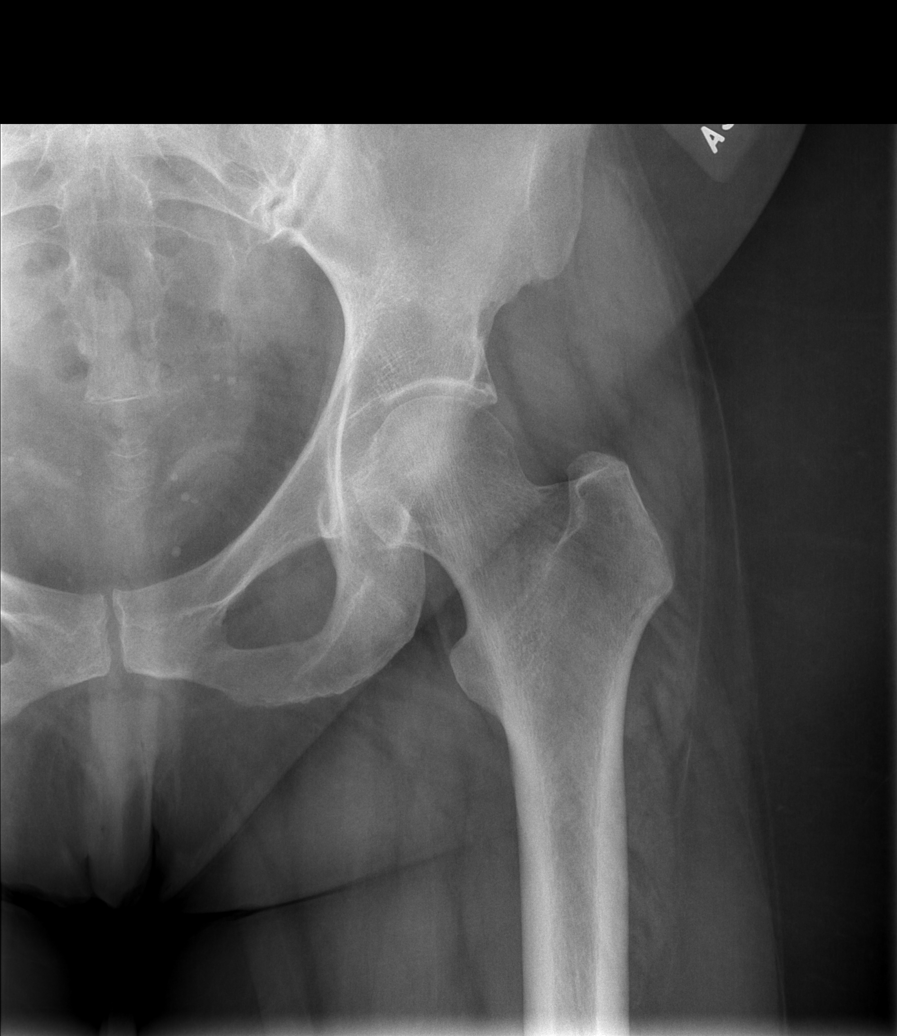

[t hip frog leg left *]
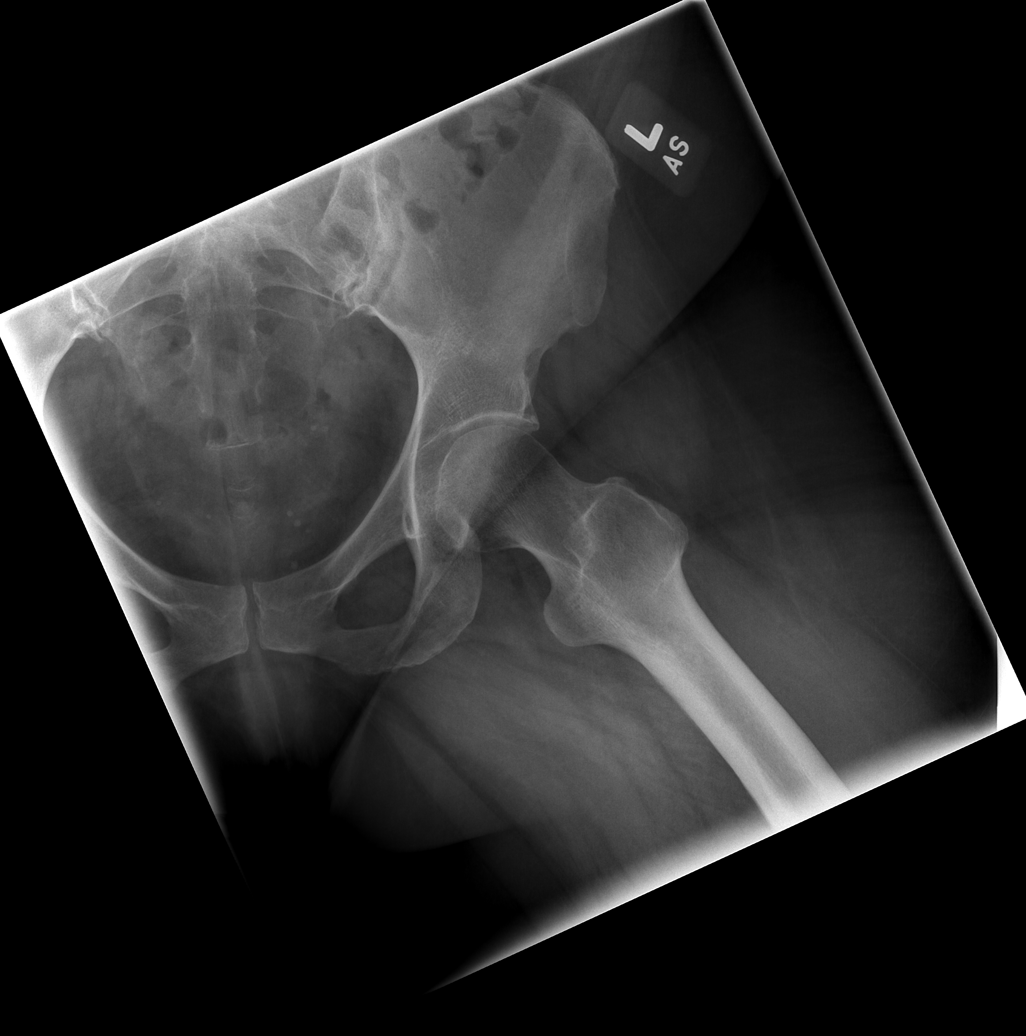

[t hip ap right *]
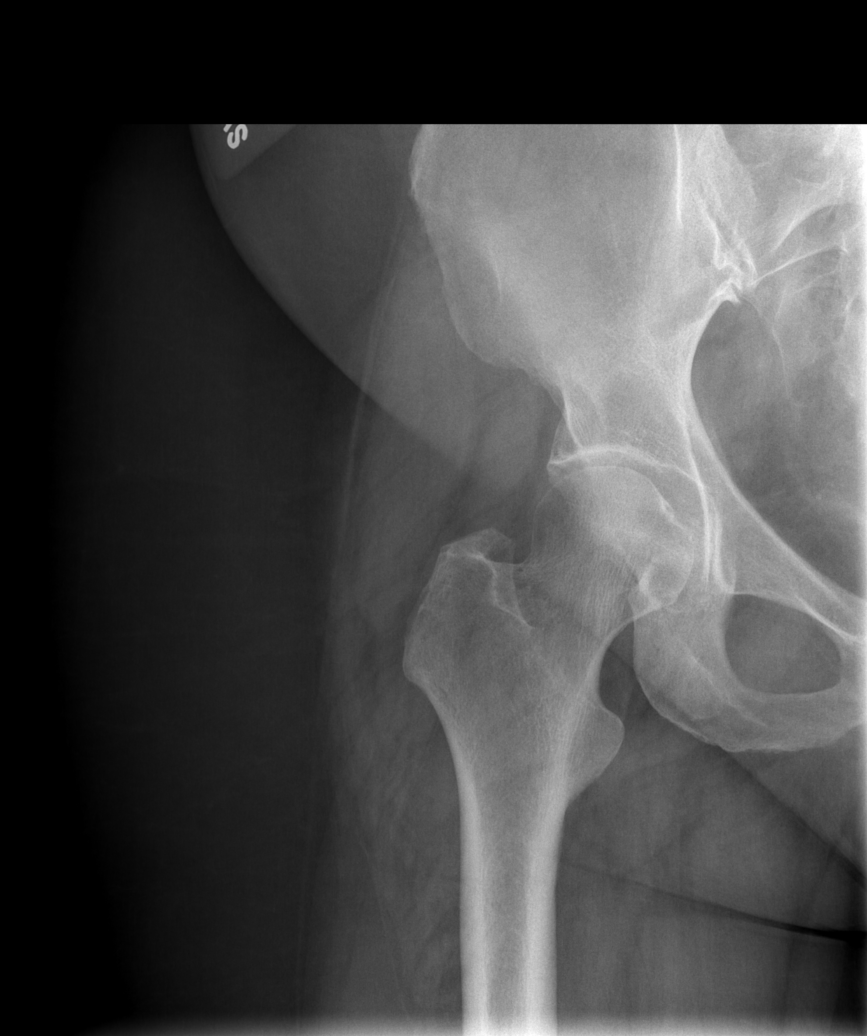

[t hip frog leg right *]
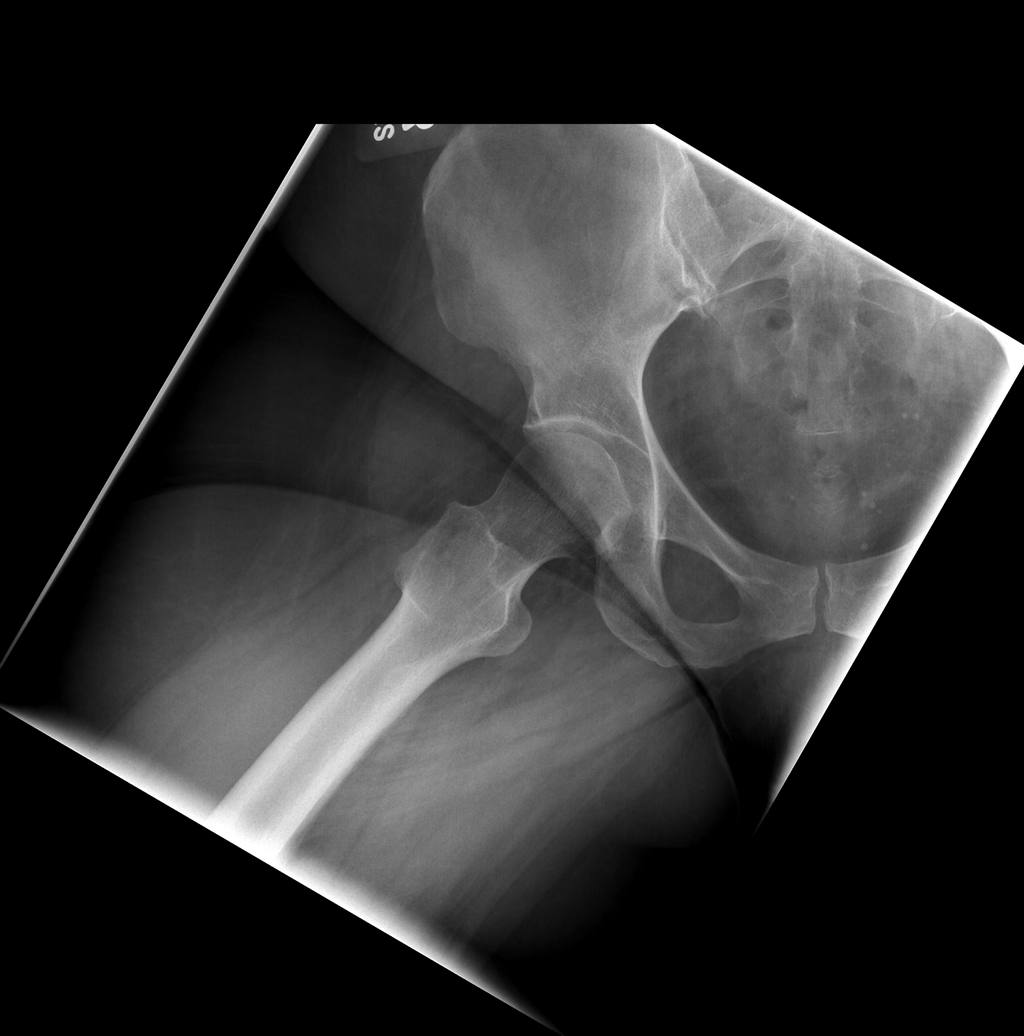

[5 of 5 positions shown; findings below may reference images not displayed]

FINDINGS: There is no evidence of hip fracture or dislocation. Bilateral hip
joint spaces are maintained. There is no evidence of arthropathy or
other focal bone abnormality.
IMPRESSION: Negative.

## 2021-10-30 ENCOUNTER — Other Ambulatory Visit: Payer: 59

## 2021-11-01 ENCOUNTER — Other Ambulatory Visit: Payer: Self-pay | Admitting: Cardiology

## 2021-11-02 ENCOUNTER — Ambulatory Visit: Payer: 59 | Admitting: Family

## 2021-11-24 ENCOUNTER — Other Ambulatory Visit: Payer: Self-pay | Admitting: Cardiology

## 2021-11-26 ENCOUNTER — Ambulatory Visit: Payer: 59 | Admitting: Internal Medicine

## 2021-12-03 ENCOUNTER — Ambulatory Visit (INDEPENDENT_AMBULATORY_CARE_PROVIDER_SITE_OTHER): Payer: 59 | Admitting: Internal Medicine

## 2021-12-03 ENCOUNTER — Encounter: Payer: Self-pay | Admitting: Internal Medicine

## 2021-12-03 ENCOUNTER — Other Ambulatory Visit: Payer: Self-pay | Admitting: Internal Medicine

## 2021-12-03 ENCOUNTER — Other Ambulatory Visit (HOSPITAL_COMMUNITY): Payer: Self-pay

## 2021-12-03 ENCOUNTER — Other Ambulatory Visit: Payer: Self-pay | Admitting: Family

## 2021-12-03 ENCOUNTER — Other Ambulatory Visit: Payer: Self-pay

## 2021-12-03 ENCOUNTER — Telehealth: Payer: Self-pay

## 2021-12-03 VITALS — BP 124/82 | HR 78 | Ht 67.0 in | Wt 238.0 lb

## 2021-12-03 DIAGNOSIS — Z794 Long term (current) use of insulin: Secondary | ICD-10-CM

## 2021-12-03 DIAGNOSIS — E119 Type 2 diabetes mellitus without complications: Secondary | ICD-10-CM

## 2021-12-03 LAB — LIPID PANEL
Cholesterol: 145 mg/dL (ref 0–200)
HDL: 52.6 mg/dL (ref >39.00)
LDL Cholesterol: 65 mg/dL (ref 0–99)
NonHDL: 92.61
Total CHOL/HDL Ratio: 3
Triglycerides: 136 mg/dL (ref 0.0–149.0)
VLDL: 27.2 mg/dL (ref 0.0–40.0)

## 2021-12-03 LAB — COMPREHENSIVE METABOLIC PANEL
ALT: 18 U/L (ref 0–35)
AST: 16 U/L (ref 0–37)
Albumin: 4.2 g/dL (ref 3.5–5.2)
Alkaline Phosphatase: 105 U/L (ref 39–117)
BUN: 20 mg/dL (ref 6–23)
CO2: 28 mEq/L (ref 19–32)
Calcium: 9.3 mg/dL (ref 8.4–10.5)
Chloride: 106 mEq/L (ref 96–112)
Creatinine, Ser: 0.99 mg/dL (ref 0.40–1.20)
GFR: 64.11 mL/min (ref >60.00)
Glucose, Bld: 174 mg/dL — ABNORMAL HIGH (ref 70–99)
Potassium: 4.5 mEq/L (ref 3.5–5.1)
Sodium: 142 mEq/L (ref 135–145)
Total Bilirubin: 0.7 mg/dL (ref 0.2–1.2)
Total Protein: 7.2 g/dL (ref 6.0–8.3)

## 2021-12-03 LAB — MICROALBUMIN / CREATININE URINE RATIO
Creatinine,U: 79.7 mg/dL
Microalb Creat Ratio: 0.9 mg/g (ref 0.0–30.0)
Microalb, Ur: <0.7 mg/dL (ref 0.0–1.9)

## 2021-12-03 LAB — POCT GLYCOSYLATED HEMOGLOBIN (HGB A1C): Hemoglobin A1C: 7.8 % — AB (ref 4.0–5.6)

## 2021-12-03 MED ORDER — METOPROLOL SUCCINATE ER 50 MG PO TB24: 50.0000 mg | ORAL_TABLET | Freq: Every day | ORAL | 0 refills | Status: DC

## 2021-12-03 MED ORDER — FREESTYLE LIBRE 14 DAY SENSOR MISC: 1.0000 | 3 refills | Status: DC

## 2021-12-03 MED ORDER — LEVEMIR FLEXTOUCH 100 UNIT/ML ~~LOC~~ SOPN: 30.0000 [IU] | PEN_INJECTOR | Freq: Every day | SUBCUTANEOUS | 4 refills | Status: DC

## 2021-12-03 MED ORDER — TIRZEPATIDE 2.5 MG/0.5ML ~~LOC~~ SOAJ: 2.5000 mg | SUBCUTANEOUS | 1 refills | Status: DC

## 2021-12-03 MED ORDER — OZEMPIC (0.25 OR 0.5 MG/DOSE) 2 MG/3ML ~~LOC~~ SOPN
0.5000 mg | PEN_INJECTOR | SUBCUTANEOUS | 2 refills | Status: DC
  Filled 2022-03-11: qty 3, 28d supply, fill #0

## 2021-12-03 MED ORDER — INSULIN PEN NEEDLE 31G X 5 MM MISC
1.0000 | Freq: Every day | 3 refills | Status: DC
  Filled 2022-04-10: qty 100, 90d supply, fill #0
  Filled 2022-04-10: qty 100, 30d supply, fill #0

## 2021-12-03 NOTE — Telephone Encounter (Signed)
Patient Advocate Encounter   Received notification from [facility] that prior authorization for Ozempic 2mg /1.40ml pen injectors is required by his/her insurance [employer].   PA submitted on 12/03/21  Key#: [id number]  Status is pending    [facility] will continue to follow:  Patient Advocate Fax: [phone]

## 2021-12-03 NOTE — Progress Notes (Signed)
Name: Shelly Whitehead  MRN/ DOB: [id number], 1966/10/22   Age/ Sex: 56 y.o., female    PCP: Ngetich, Donalee Citrin, NP   Reason for Endocrinology Evaluation: Type 2 Diabetes Mellitus     Date of Initial Endocrinology Visit: 12/03/2021     PATIENT IDENTIFIER: Shelly Whitehead is a 56 y.o. female with a past medical history of T2DM, dyslipidemia, Hx of PCOS and migraine headaches. The patient presented for initial endocrinology clinic visit on 12/03/2021 for consultative assistance with her diabetes management.    HPI: Shelly Whitehead was    Diagnosed with DM 2015 Prior Medications tried/Intolerance: Metformin- diarrhea . Bydureon - renal function was low ? But no side effects . Trulicity -nausea  Currently checking blood sugars multiple  x / day,  through CGM Hypoglycemia episodes : no                 Hemoglobin A1c has ranged from 6.6% in 2019, peaking at 7.5% in 2021. Patient required assistance for hypoglycemia: no Patient has required hospitalization within the last 1 year from hyper or hypoglycemia: no  In terms of diet, the patient eats 2 meals a day, occasional snacking. Avoids sugar sweetened beverages   She has recurrent yeast infections and require  monthly diflucan      HOME DIABETES REGIMEN: Levemir 30 units daily  Jardiance 10 mg daily     Statin: yes ACE-I/ARB: yes Prior Diabetic Education: yes    CONTINUOUS GLUCOSE MONITORING RECORD INTERPRETATION    Dates of Recording: 1/08/11/22/2023  Sensor description: freestyle libre   Results statistics:   CGM use % of time 40  Average and SD 147/26.3  Time in range   80     %  % Time Above 180 20  % Time above 250 0  % Time Below target 0      Glycemic patterns summary: BG's optimal at night and during the day with hyperglycemia postprandial   Hyperglycemic episodes  postprandial  Hypoglycemic episodes occurred n/a  Overnight periods: trends down      DIABETIC COMPLICATIONS: Microvascular complications:    Denies: CKD, retinopathy, neuropathy  Last eye exam: Completed 11/2021  Macrovascular complications:   Denies: CAD, PVD, CVA   PAST HISTORY: Past Medical History:  Past Medical History:  Diagnosis Date   Adhesive capsulitis of right shoulder    ASCUS of cervix with negative high risk HPV    Diabetes mellitus without complication (HCC)    Migraine with aura and with status migrainosus, not intractable    Postmenopausal bleeding    SVT (supraventricular tachycardia) (HCC)    Past Surgical History:  Past Surgical History:  Procedure Laterality Date   APPENDECTOMY     GALLBLADDER SURGERY  1998   SHOULDER SURGERY  2016    Social History:  reports that she has never smoked. She has never used smokeless tobacco. She reports current alcohol use of about 2.0 - 3.0 standard drinks per week. She reports that she does not use drugs. Family History:  Family History  Problem Relation Age of Onset   Alzheimer's disease Mother    Cancer Father        Bladder     HOME MEDICATIONS: Allergies as of 12/03/2021       Reactions   Metformin Diarrhea, Nausea Only   Oxycodone Itching   Oxycodone-acetaminophen Itching        Medication List        Accurate as of December 03, 2021  8:57 AM.  If you have any questions, ask your nurse or doctor.          STOP taking these medications    aspirin 81 MG EC tablet Stopped by: Scarlette Shorts, MD   FreeStyle Libre 14 Day Reader Hardie Pulley Stopped by: Scarlette Shorts, MD       TAKE these medications    atorvastatin 20 MG tablet Commonly known as: LIPITOR Take 1 tablet (20 mg total) by mouth daily.   B-D ULTRAFINE III SHORT PEN 31G X 8 MM Misc Generic drug: Insulin Pen Needle USE TO INJECT INSULIN DAILY (INSURANCE MAX 30 DAYS, BUT ACTUALLY 90 DAYS) DX: E11.9   empagliflozin 10 MG Tabs tablet Commonly known as: Jardiance Take 1 tablet (10 mg total) by mouth daily.   fluconazole 150 MG tablet Commonly known as:  DIFLUCAN Take 1 tablet (150 mg total) by mouth every 14 (fourteen) days.   FreeStyle Libre 14 Day Sensor Misc USE TO TEST BLOOD SUGAR THREE TIMES DAILY. DX E11.9   Levemir FlexTouch 100 UNIT/ML FlexPen Generic drug: insulin detemir INJECT 30 UNITS INTO THE SKIN DAILY.   losartan 25 MG tablet Commonly known as: COZAAR TAKE 1 TABLET BY MOUTH EVERY DAY   metoprolol succinate 50 MG 24 hr tablet Commonly known as: TOPROL-XL TAKE 1 TABLET (50 MG TOTAL) BY MOUTH DAILY. PATIENT NEEDS TO SCHEDULE AN OFFICE VISIT FOR FUTURE REFILLS. (2ND ATTEMPT)         ALLERGIES: Allergies  Allergen Reactions   Metformin Diarrhea and Nausea Only   Oxycodone Itching   Oxycodone-Acetaminophen Itching     REVIEW OF SYSTEMS: A comprehensive ROS was conducted with the patient and is negative except as per HPI    OBJECTIVE:   VITAL SIGNS: BP 124/82 (BP Location: Left Arm, Patient Position: Sitting, Cuff Size: Small)    Pulse 78    Ht 5\' 7"  (1.702 m)    Wt 238 lb (108 kg)    SpO2 98%    BMI 37.28 kg/m    PHYSICAL EXAM:  General: Pt appears well and is in NAD  Neck: General: Supple without adenopathy or carotid bruits. Thyroid: Thyroid size normal.  No goiter or nodules appreciated.   Lungs: Clear with good BS bilat with no rales, rhonchi, or wheezes  Heart: RRR  Abdomen: Normoactive bowel sounds, soft, nontender, without masses or organomegaly palpable  Extremities:  Lower extremities - No pretibial edema. No lesions.  Neuro: MS is good with appropriate affect, pt is alert and Ox3    DM foot exam: 12/03/2021  The skin of the feet is intact without sores or ulcerations. The pedal pulses are 2+ on right and 2+ on left. The sensation is intact to a screening 5.07, 10 gram monofilament bilaterally   DATA REVIEWED:  Lab Results  Component Value Date   HGBA1C 7.8 (A) 12/03/2021   HGBA1C 7.2 (H) 04/30/2021   HGBA1C 7.5 (H) 08/14/2020    Latest Reference Range & Units 12/03/21 09:10   Sodium 135 - 145 mEq/L 142  Potassium 3.5 - 5.1 mEq/L 4.5  Chloride 96 - 112 mEq/L 106  CO2 19 - 32 mEq/L 28  Glucose 70 - 99 mg/dL 12/05/21 (H)  BUN 6 - 23 mg/dL 20  Creatinine 818 - 5.63 mg/dL 1.49  Calcium 8.4 - 7.02 mg/dL 9.3  Alkaline Phosphatase 39 - 117 U/L 105  Albumin 3.5 - 5.2 g/dL 4.2  AST 0 - 37 U/L 16  ALT 0 - 35 U/L 18  Total  Protein 6.0 - 8.3 g/dL 7.2  Total Bilirubin 0.2 - 1.2 mg/dL 0.7  GFR >78.46>60.00 mL/min 64.11  Total CHOL/HDL Ratio  3  Cholesterol 0 - 200 mg/dL 962145  HDL Cholesterol >95.28>39.00 mg/dL 41.3252.60  LDL (calc) 0 - 99 mg/dL 65  MICROALB/CREAT RATIO 0.0 - 30.0 mg/g 0.9  NonHDL  92.61  Triglycerides 0.0 - 149.0 mg/dL 440.1136.0  VLDL 0.0 - 02.740.0 mg/dL 25.327.2     Latest Reference Range & Units 12/03/21 09:10  Creatinine,U mg/dL 66.479.7  Microalb, Ur 0.0 - 1.9 mg/dL <4.0<0.7  MICROALB/CREAT RATIO 0.0 - 30.0 mg/g 0.9     ASSESSMENT / PLAN / RECOMMENDATIONS:   1) Type 2 Diabetes Mellitus, Sub-Optimally controlled, Without complications - Most recent A1c of 7.8 %. Goal A1c < 7.0 %.    Plan: GENERAL: I have discussed with the patient the pathophysiology of diabetes. We went over the natural progression of the disease. I explained the complications associated with diabetes including retinopathy, nephropathy, neuropathy as well as increased risk of cardiovascular disease. We went over the benefit seen with glycemic control.   I explained to the patient that diabetic patients are at higher than normal risk for amputations.  Pt with recurrent genital yeast infection, requiring a standing dose of monthly Diflucan, I have recommended stopping this  She is intolerant to Trulicity due to nausea. No hx of pancreatitis  We discussed starting Mounjaro , we discussed GI side effects but unfortunately this is not covered by her insurance company, will start Ozempic Discussed reducing CHO intake for supper   MEDICATIONS: STOP Jardiance  Continue Levemir 30 units daily  Start Ozempic 0.25  mg weeky , may increase to 0.5 mg weekly if no side effects  EDUCATION / INSTRUCTIONS: BG monitoring instructions: Patient is instructed to check her blood sugars 3 times a day, before meals . Call [facility] if: BG persistently < 70  I reviewed the Rule of 15 for the treatment of hypoglycemia in detail with the patient. Literature supplied.   2) Diabetic complications:  Eye: Does not have known diabetic retinopathy.  Neuro/ Feet: Does not have known diabetic peripheral neuropathy. Renal: Patient does not have known baseline CKD. She is  on an ACEI/ARB at present.   3) Dyslipidemia:   - LDL at goal  -We discussed cardiovascular benefits of statins   Medication  Continue atorvastatin 20 mg daily     Follow-up in 3 months       Signed electronically by: Lyndle HerrlichAbby Jaralla Trinh Sanjose, MD  [facility]  [facility] [address] [place], [place] [address] Phone: [phone] FAX: [phone]   CC: Caesar Bookmangetich, Dinah C, NP [address] [place] [address] Phone: [phone]  Fax: [phone]    Return to Endocrinology clinic as below: No future appointments.

## 2021-12-03 NOTE — Patient Instructions (Addendum)
STOP Jardiance  Continue Levemir 30 units daily  Start Mounjaro 2.5 mg weekly   HOW TO TREAT LOW BLOOD SUGARS (Blood sugar LESS THAN 70 MG/DL) Please follow the RULE OF 15 for the treatment of hypoglycemia treatment (when your (blood sugars are less than 70 mg/dL)   STEP 1: Take 15 grams of carbohydrates when your blood sugar is low, which includes:  3-4 GLUCOSE TABS  OR 3-4 OZ OF JUICE OR REGULAR SODA OR ONE TUBE OF GLUCOSE GEL    STEP 2: RECHECK blood sugar in 15 MINUTES STEP 3: If your blood sugar is still low at the 15 minute recheck --> then, go back to STEP 1 and treat AGAIN with another 15 grams of carbohydrates.

## 2021-12-04 ENCOUNTER — Other Ambulatory Visit (HOSPITAL_COMMUNITY): Payer: Self-pay

## 2021-12-04 NOTE — Telephone Encounter (Signed)
Patient Advocate Encounter  Received notification from [facility] [employer] that the request for prior authorization for Ozempic has been denied due to no reduction in the pts. A1C.  Also, the pt has not been receiving a stable dose of Ozempic or a med. in a similar class for at least 3 months.    [facility] Pharm[facility] Patient Advocate Fax:  [phone]

## 2021-12-06 ENCOUNTER — Encounter: Payer: Self-pay | Admitting: Internal Medicine

## 2021-12-10 ENCOUNTER — Other Ambulatory Visit (HOSPITAL_COMMUNITY): Payer: Self-pay

## 2021-12-11 ENCOUNTER — Other Ambulatory Visit (HOSPITAL_COMMUNITY): Payer: Self-pay

## 2021-12-13 ENCOUNTER — Other Ambulatory Visit (HOSPITAL_COMMUNITY): Payer: Self-pay

## 2021-12-17 ENCOUNTER — Telehealth: Payer: Self-pay

## 2021-12-17 NOTE — Telephone Encounter (Signed)
Ozempic has been approved from 12/15/21 to 12/15/2024. Approval received through fax.

## 2021-12-18 ENCOUNTER — Other Ambulatory Visit (HOSPITAL_COMMUNITY): Payer: Self-pay

## 2022-01-05 ENCOUNTER — Other Ambulatory Visit: Payer: Self-pay | Admitting: Family

## 2022-01-16 ENCOUNTER — Other Ambulatory Visit (HOSPITAL_COMMUNITY): Payer: Self-pay

## 2022-01-16 NOTE — Telephone Encounter (Signed)
Patient Advocate Encounter ? ?Prior Authorization appeal for Ozempic has been approved.   ? ?PA# [id number] ? ?Effective dates: 12/25/21 through 12/15/24 ? ?Refill too soon. ? ?Spoke with Pharmacy to Process. ? ?Patient Advocate ?Fax: [phone]  ?

## 2022-01-22 ENCOUNTER — Encounter: Payer: Self-pay | Admitting: Internal Medicine

## 2022-01-22 ENCOUNTER — Other Ambulatory Visit: Payer: Self-pay

## 2022-01-22 ENCOUNTER — Ambulatory Visit (INDEPENDENT_AMBULATORY_CARE_PROVIDER_SITE_OTHER): Payer: 59 | Admitting: Internal Medicine

## 2022-01-22 VITALS — BP 128/84 | HR 87 | Temp 97.9°F | Ht 67.0 in | Wt 233.0 lb

## 2022-01-22 DIAGNOSIS — Z0001 Encounter for general adult medical examination with abnormal findings: Secondary | ICD-10-CM

## 2022-01-22 DIAGNOSIS — E785 Hyperlipidemia, unspecified: Secondary | ICD-10-CM | POA: Insufficient documentation

## 2022-01-22 DIAGNOSIS — Z794 Long term (current) use of insulin: Secondary | ICD-10-CM

## 2022-01-22 DIAGNOSIS — E119 Type 2 diabetes mellitus without complications: Secondary | ICD-10-CM | POA: Diagnosis not present

## 2022-01-22 DIAGNOSIS — I471 Supraventricular tachycardia: Secondary | ICD-10-CM | POA: Diagnosis not present

## 2022-01-22 MED ORDER — ATORVASTATIN CALCIUM 20 MG PO TABS
20.0000 mg | ORAL_TABLET | Freq: Every day | ORAL | 1 refills | Status: DC
  Filled 2022-04-10: qty 30, 30d supply, fill #0
  Filled 2022-05-28: qty 30, 30d supply, fill #1
  Filled 2022-06-24: qty 30, 30d supply, fill #2

## 2022-01-22 NOTE — Progress Notes (Signed)
? ?Subjective:  ?Patient ID: Shelly Whitehead, female    DOB: 1965-12-30  Age: 56 y.o. MRN: [id number] ? ?CC: Annual Exam, Diabetes, Hyperlipidemia, and Hypertension ? ?This visit occurred during the SARS-CoV-2 public health emergency.  Safety protocols were in place, including screening questions prior to the visit, additional usage of staff PPE, and extensive cleaning of exam room while observing appropriate contact time as indicated for disinfecting solutions.   ? ?HPI ?Shelly Whitehead presents for a CPX and f/up -  ? ?She has a hx of SVT. She has rare palpitations with no associated symptoms. She is active and denies DOE, CP, SOB. ? ?History ?Shelly Whitehead has a past medical history of Adhesive capsulitis of right shoulder, ASCUS of cervix with negative high risk HPV, Diabetes mellitus without complication (HCC), Migraine with aura and with status migrainosus, not intractable, Postmenopausal bleeding, and SVT (supraventricular tachycardia) (HCC).  ? ?She has a past surgical history that includes Gallbladder surgery (1998); Appendectomy; and Shoulder surgery (2016).  ? ?Her family history includes Alzheimer's disease in her mother; Cancer in her father.She reports that she has never smoked. She has never used smokeless tobacco. She reports current alcohol use of about 3.0 standard drinks per week. She reports that she does not use drugs. ? ?Outpatient Medications Prior to Visit  ?Medication Sig Dispense Refill  ? Continuous Blood Gluc Sensor (FREESTYLE LIBRE 14 DAY SENSOR) MISC 1 Device by Other route every 14 (fourteen) days. 6 each 3  ? insulin detemir (LEVEMIR FLEXTOUCH) 100 UNIT/ML FlexPen Inject 30 Units into the skin daily. 30 mL 4  ? Insulin Pen Needle 31G X 5 MM MISC 1 Device by Does not apply route daily. 100 each 3  ? losartan (COZAAR) 25 MG tablet TAKE 1 TABLET BY MOUTH EVERY DAY 90 tablet 1  ? metoprolol succinate (TOPROL-XL) 50 MG 24 hr tablet Take 1 tablet (50 mg total) by mouth daily. PATIENT NEEDS TO SCHEDULE  AN OFFICE VISIT FOR FUTURE REFILLS. (2nd ATTEMPT) 90 tablet 0  ? Semaglutide,0.25 or 0.5MG /DOS, (OZEMPIC, 0.25 OR 0.5 MG/DOSE,) 2 MG/1.5ML SOPN Inject 0.5 mg into the skin once a week. 4.5 mL 3  ? atorvastatin (LIPITOR) 20 MG tablet TAKE 1 TABLET BY MOUTH EVERY DAY 90 tablet 1  ? ?No facility-administered medications prior to visit.  ? ? ?ROS ?Review of Systems  ?Constitutional: Negative.  Negative for chills, diaphoresis, fatigue and fever.  ?HENT: Negative.    ?Eyes: Negative.   ?Respiratory: Negative.  Negative for cough, shortness of breath and wheezing.   ?Cardiovascular:  Positive for palpitations. Negative for chest pain and leg swelling.  ?Gastrointestinal:  Negative for abdominal pain, diarrhea and nausea.  ?Endocrine: Negative.   ?Genitourinary: Negative.  Negative for difficulty urinating.  ?Musculoskeletal:  Positive for arthralgias. Negative for myalgias.  ?Skin: Negative.   ?Neurological:  Negative for dizziness, syncope, weakness, light-headedness and headaches.  ?Hematological:  Negative for adenopathy. Does not bruise/bleed easily.  ?Psychiatric/Behavioral: Negative.    ? ?Objective:  ?BP 128/84   Pulse 87   Temp 97.9 ?F (36.6 ?C) (Oral)   Ht 5\' 7"  (1.702 m)   Wt 233 lb (105.7 kg)   SpO2 97%   BMI 36.49 kg/m?  ? ?Physical Exam ?Vitals reviewed.  ?Constitutional:   ?   Appearance: She is not ill-appearing.  ?HENT:  ?   Nose: Nose normal.  ?   Mouth/Throat:  ?   Mouth: Mucous membranes are moist.  ?Eyes:  ?   General: No scleral icterus. ?  Conjunctiva/sclera: Conjunctivae normal.  ?Cardiovascular:  ?   Rate and Rhythm: Normal rate and regular rhythm.  ?   Heart sounds: No murmur heard. ?   Comments: EKG- ?NSR, 68 bpm ?? LAE  ?No LVH or Q waves ?Pulmonary:  ?   Effort: Pulmonary effort is normal.  ?   Breath sounds: No stridor. No wheezing, rhonchi or rales.  ?Abdominal:  ?   General: Abdomen is flat.  ?   Palpations: There is no mass.  ?   Tenderness: There is no abdominal tenderness. There is  no guarding.  ?Musculoskeletal:  ?   Cervical back: Neck supple.  ?   Right lower leg: No edema.  ?   Left lower leg: No edema.  ?Lymphadenopathy:  ?   Cervical: No cervical adenopathy.  ?Skin: ?   General: Skin is warm and dry.  ?   Findings: No rash.  ?Neurological:  ?   General: No focal deficit present.  ?   Mental Status: She is alert. Mental status is at baseline.  ?Psychiatric:     ?   Mood and Affect: Mood normal.     ?   Behavior: Behavior normal.  ? ? ?Lab Results  ?Component Value Date  ? WBC 4.6 04/30/2021  ? HGB 15.2 04/30/2021  ? HCT 45.7 (H) 04/30/2021  ? PLT 232 04/30/2021  ? GLUCOSE 174 (H) 12/03/2021  ? CHOL 145 12/03/2021  ? TRIG 136.0 12/03/2021  ? HDL 52.60 12/03/2021  ? LDLCALC 65 12/03/2021  ? ALT 18 12/03/2021  ? AST 16 12/03/2021  ? NA 142 12/03/2021  ? K 4.5 12/03/2021  ? CL 106 12/03/2021  ? CREATININE 0.99 12/03/2021  ? BUN 20 12/03/2021  ? CO2 28 12/03/2021  ? TSH 2.50 04/30/2021  ? HGBA1C 7.8 (A) 12/03/2021  ? MICROALBUR <0.7 12/03/2021  ?  ? ?Assessment & Plan:  ? ?Shelly Whitehead was seen today for annual exam, diabetes, hyperlipidemia and hypertension. ? ?Diagnoses and all orders for this visit: ? ?Type 2 diabetes mellitus without complication, with long-term current use of insulin (HCC)- Managed by ENDO. ?-     HM Diabetes Foot Exam ? ?Hyperlipidemia LDL goal <100- LDL goal achieved. Doing well on the statin  ?-     atorvastatin (LIPITOR) 20 MG tablet; Take 1 tablet (20 mg total) by mouth daily. ? ?Paroxysmal SVT (supraventricular tachycardia) (HCC)- Her symptoms are adequately well controlled.  She will stay on the current dose of metoprolol. ?-     EKG 12-Lead ? ?Encounter for general adult medical examination with abnormal findings- Exam completed, labs reviewed, vaccines are UTD, cancer screenings are up-to-date, patient education was given. ? ? ?I have changed Shelly Whitehead's atorvastatin. I am also having her maintain her losartan, Levemir FlexTouch, Insulin Pen Needle, Ozempic (0.25  or 0.5 MG/DOSE), FreeStyle Libre 14 Day Sensor, and metoprolol succinate. ? ?Meds ordered this encounter  ?Medications  ? atorvastatin (LIPITOR) 20 MG tablet  ?  Sig: Take 1 tablet (20 mg total) by mouth daily.  ?  Dispense:  90 tablet  ?  Refill:  1  ? ? ? ?Follow-up: Return in about 6 months (around 07/25/2022). ? ?Sanda Linger, MD ?

## 2022-01-22 NOTE — Patient Instructions (Signed)

## 2022-01-23 DIAGNOSIS — Z0001 Encounter for general adult medical examination with abnormal findings: Secondary | ICD-10-CM | POA: Insufficient documentation

## 2022-01-25 ENCOUNTER — Encounter: Payer: Self-pay | Admitting: Internal Medicine

## 2022-02-13 ENCOUNTER — Other Ambulatory Visit: Payer: Self-pay | Admitting: Internal Medicine

## 2022-02-13 DIAGNOSIS — Z1231 Encounter for screening mammogram for malignant neoplasm of breast: Secondary | ICD-10-CM

## 2022-03-04 ENCOUNTER — Other Ambulatory Visit: Payer: Self-pay | Admitting: Family

## 2022-03-07 ENCOUNTER — Ambulatory Visit: Payer: 59 | Admitting: Internal Medicine

## 2022-03-11 ENCOUNTER — Other Ambulatory Visit (HOSPITAL_COMMUNITY): Payer: Self-pay

## 2022-03-11 ENCOUNTER — Other Ambulatory Visit: Payer: Self-pay | Admitting: Internal Medicine

## 2022-03-11 ENCOUNTER — Encounter: Payer: Self-pay | Admitting: Internal Medicine

## 2022-03-11 DIAGNOSIS — I471 Supraventricular tachycardia: Secondary | ICD-10-CM

## 2022-03-11 MED ORDER — METOPROLOL SUCCINATE ER 50 MG PO TB24
50.0000 mg | ORAL_TABLET | Freq: Every day | ORAL | 0 refills | Status: DC
  Filled 2022-07-05: qty 30, 30d supply, fill #0
  Filled 2022-07-18: qty 30, 30d supply, fill #1
  Filled 2022-07-28: qty 12, 12d supply, fill #1
  Filled 2022-07-31: qty 18, 18d supply, fill #1
  Filled 2022-08-19: qty 30, 30d supply, fill #2

## 2022-03-12 ENCOUNTER — Other Ambulatory Visit: Payer: Self-pay | Admitting: Internal Medicine

## 2022-03-12 ENCOUNTER — Other Ambulatory Visit (HOSPITAL_COMMUNITY): Payer: Self-pay

## 2022-03-12 MED ORDER — OZEMPIC (0.25 OR 0.5 MG/DOSE) 2 MG/3ML ~~LOC~~ SOPN
0.5000 mg | PEN_INJECTOR | SUBCUTANEOUS | 2 refills | Status: DC
  Filled 2022-03-12: qty 9, 84d supply, fill #0

## 2022-03-13 ENCOUNTER — Other Ambulatory Visit (HOSPITAL_COMMUNITY): Payer: Self-pay

## 2022-03-29 ENCOUNTER — Encounter: Payer: Self-pay | Admitting: Internal Medicine

## 2022-03-29 ENCOUNTER — Other Ambulatory Visit (HOSPITAL_COMMUNITY): Payer: Self-pay

## 2022-03-29 ENCOUNTER — Ambulatory Visit (INDEPENDENT_AMBULATORY_CARE_PROVIDER_SITE_OTHER): Payer: 59 | Admitting: Internal Medicine

## 2022-03-29 VITALS — BP 130/74 | HR 80 | Ht 67.0 in | Wt 228.0 lb

## 2022-03-29 DIAGNOSIS — E119 Type 2 diabetes mellitus without complications: Secondary | ICD-10-CM | POA: Diagnosis not present

## 2022-03-29 DIAGNOSIS — R739 Hyperglycemia, unspecified: Secondary | ICD-10-CM

## 2022-03-29 LAB — POCT GLYCOSYLATED HEMOGLOBIN (HGB A1C): Hemoglobin A1C: 6.9 % — AB (ref 4.0–5.6)

## 2022-03-29 MED ORDER — DEXCOM G7 SENSOR MISC
1.0000 | 3 refills | Status: DC
  Filled 2022-03-29: qty 3, 30d supply, fill #0
  Filled 2022-04-24: qty 3, 30d supply, fill #1
  Filled 2022-05-28: qty 3, 30d supply, fill #2
  Filled 2022-06-24: qty 3, 30d supply, fill #3
  Filled 2022-07-18: qty 3, 30d supply, fill #4
  Filled 2022-08-19: qty 3, 30d supply, fill #5
  Filled 2022-09-19: qty 3, 30d supply, fill #6
  Filled 2022-10-16: qty 3, 30d supply, fill #7
  Filled 2022-11-05 – 2022-11-12 (×2): qty 3, 30d supply, fill #8
  Filled 2022-12-11: qty 3, 30d supply, fill #9
  Filled 2023-01-04: qty 3, 30d supply, fill #10
  Filled 2023-02-06: qty 3, 30d supply, fill #11

## 2022-03-29 MED ORDER — LEVEMIR FLEXPEN 100 UNIT/ML ~~LOC~~ SOPN
24.0000 [IU] | PEN_INJECTOR | Freq: Every day | SUBCUTANEOUS | 4 refills | Status: DC
  Filled 2022-03-29: qty 6, 25d supply, fill #0
  Filled 2022-07-18: qty 6, 25d supply, fill #1
  Filled 2022-08-19: qty 6, 25d supply, fill #2
  Filled 2022-09-11: qty 6, 25d supply, fill #3
  Filled 2022-10-10: qty 6, 25d supply, fill #4

## 2022-03-29 MED ORDER — OZEMPIC (0.25 OR 0.5 MG/DOSE) 2 MG/3ML ~~LOC~~ SOPN
0.5000 mg | PEN_INJECTOR | SUBCUTANEOUS | 3 refills | Status: DC
  Filled 2022-03-29: qty 9, 84d supply, fill #0
  Filled 2022-04-10: qty 3, 28d supply, fill #0
  Filled 2022-05-03: qty 3, 28d supply, fill #1
  Filled 2022-05-28: qty 3, 28d supply, fill #2

## 2022-03-29 NOTE — Patient Instructions (Signed)
  Decrease  Levemir 24 units daily  Ozempic 0.5 mg weekly   HOW TO TREAT LOW BLOOD SUGARS (Blood sugar LESS THAN 70 MG/DL) Please follow the RULE OF 15 for the treatment of hypoglycemia treatment (when your (blood sugars are less than 70 mg/dL)   STEP 1: Take 15 grams of carbohydrates when your blood sugar is low, which includes:  3-4 GLUCOSE TABS  OR 3-4 OZ OF JUICE OR REGULAR SODA OR ONE TUBE OF GLUCOSE GEL    STEP 2: RECHECK blood sugar in 15 MINUTES STEP 3: If your blood sugar is still low at the 15 minute recheck --> then, go back to STEP 1 and treat AGAIN with another 15 grams of carbohydrates.

## 2022-03-29 NOTE — Progress Notes (Signed)
Name: Shelly Whitehead  MRN/ DOB: [id number], [birth year]   Age/ Sex: 56 y.o., female    PCP: Etta GrandchildJones, Thomas L, MD   Reason for Endocrinology Evaluation: Type 2 Diabetes Mellitus     Date of Initial Endocrinology Visit: 12/03/2021    PATIENT IDENTIFIER: [name] is a 56 y.o. female with a past medical history of T2DM, dyslipidemia, Hx of PCOS and migraine headaches. The patient presented for initial endocrinology clinic visit on 12/03/2021 for consultative assistance with her diabetes management.    HPI: Shelly Whitehead was    Diagnosed with DM 2015 Prior Medications tried/Intolerance: Metformin- diarrhea . Bydureon - renal function was low ? But no side effects . Trulicity -nausea                 Hemoglobin A1c has ranged from 6.6% in 2019, peaking at 7.5% in 2021.     On her initial visit to our clinic she had an A1c of 7.8%, she was on Levemir and Jardiance but she was having monthly yeast infection requiring monthly Diflucan prescriptions.  We stopped Jardiance and started Ozempic   SUBJECTIVE:   During the last visit (12/03/2021): A1c 7.8% stopped Jardiance, continued insulin, started Ozempic  Today (03/29/22): Shelly Whitehead is here for follow-up on diabetes management.  She checks her blood sugars multiple times daily, through freestyle libre. The patient has  had hypoglycemic episodes since the last clinic visit, which typically occur at night    Yeast infection resolved  She has occasional bloading and diarrhea with Ozempic      HOME DIABETES REGIMEN: Levemir 30 units daily  Ozempic 0.5 mg weekly    Statin: yes ACE-I/ARB: yes Prior Diabetic Education: yes    CONTINUOUS GLUCOSE MONITORING RECORD INTERPRETATION    Dates of Recording: 5/6-5/19/2023  Sensor description: freestyle libre   Results statistics:   CGM use % of time 86  Average and SD 123/27.5  Time in range   89    %  % Time Above 180 7  % Time above 250 0  % Time Below target 3       Glycemic patterns summary: Pt with hypoglycemia overnight and optimal BG's during the day   Hyperglycemic episodes  postprandial  Hypoglycemic episodes occurred over night   Overnight periods: trends down      DIABETIC COMPLICATIONS: Microvascular complications:   Denies: CKD, retinopathy, neuropathy  Last eye exam: Completed 11/2021  Macrovascular complications:   Denies: CAD, PVD, CVA   PAST HISTORY: Past Medical History:  Past Medical History:  Diagnosis Date   Adhesive capsulitis of right shoulder    ASCUS of cervix with negative high risk HPV    Diabetes mellitus without complication (HCC)    Migraine with aura and with status migrainosus, not intractable    Postmenopausal bleeding    SVT (supraventricular tachycardia) (HCC)    Past Surgical History:  Past Surgical History:  Procedure Laterality Date   APPENDECTOMY     GALLBLADDER SURGERY  1998   SHOULDER SURGERY  2016    Social History:  reports that she has never smoked. She has never used smokeless tobacco. She reports current alcohol use of about 3.0 standard drinks per week. She reports that she does not use drugs. Family History:  Family History  Problem Relation Age of Onset   Alzheimer's disease Mother    Cancer Father        Bladder     HOME MEDICATIONS: Allergies as of 03/29/2022  Reactions   Metformin Diarrhea, Nausea Only   Oxycodone Itching   Oxycodone-acetaminophen Itching        Medication List        Accurate as of Mar 29, 2022  8:18 AM. If you have any questions, ask your nurse or doctor.          atorvastatin 20 MG tablet Commonly known as: LIPITOR Take 1 tablet (20 mg total) by mouth daily.   FreeStyle Libre 14 Day Sensor Misc 1 Device by Other route every 14 (fourteen) days. What changed: Another medication with the same name was added. Make sure you understand how and when to take each. Changed by: Scarlette Shorts, MD   Dexcom G7 Sensor Misc 1  Device by Does not apply route as directed. What changed: You were already taking a medication with the same name, and this prescription was added. Make sure you understand how and when to take each. Changed by: Scarlette Shorts, MD   Insulin Pen Needle 31G X 5 MM Misc 1 Device by Does not apply route daily.   Levemir FlexTouch 100 UNIT/ML FlexPen Generic drug: insulin detemir Inject 24 Units into the skin daily. What changed: how much to take Changed by: Scarlette Shorts, MD   losartan 25 MG tablet Commonly known as: COZAAR TAKE 1 TABLET BY MOUTH EVERY DAY   metoprolol succinate 50 MG 24 hr tablet Commonly known as: TOPROL-XL Take 1 tablet (50 mg total) by mouth daily.   Ozempic (0.25 or 0.5 MG/DOSE) 2 MG/3ML Sopn Generic drug: Semaglutide(0.25 or 0.5MG /DOS) Inject 0.5 mg into the skin once a week.         ALLERGIES: Allergies  Allergen Reactions   Metformin Diarrhea and Nausea Only   Oxycodone Itching   Oxycodone-Acetaminophen Itching     REVIEW OF SYSTEMS: A comprehensive ROS was conducted with the patient and is negative except as per HPI    OBJECTIVE:   VITAL SIGNS: BP 130/74 (BP Location: Left Arm, Patient Position: Sitting, Cuff Size: Large)   Pulse 80   Ht 5\' 7"  (1.702 m)   Wt 228 lb (103.4 kg)   SpO2 99%   BMI 35.71 kg/m    PHYSICAL EXAM:  General: Pt appears well and is in NAD  Neck: General: Supple without adenopathy or carotid bruits. Thyroid: Thyroid size normal.  No goiter or nodules appreciated.   Lungs: Clear with good BS bilat with no rales, rhonchi, or wheezes  Heart: RRR  Abdomen: Normoactive bowel sounds, soft, nontender, without masses or organomegaly palpable  Extremities:  Lower extremities - No pretibial edema. No lesions.  Neuro: MS is good with appropriate affect, pt is alert and Ox3    DM foot exam: 12/03/2021  The skin of the feet is intact without sores or ulcerations. The pedal pulses are 2+ on right and 2+ on  left. The sensation is intact to a screening 5.07, 10 gram monofilament bilaterally   DATA REVIEWED:  Lab Results  Component Value Date   HGBA1C 6.9 (A) 03/29/2022   HGBA1C 7.8 (A) 12/03/2021   HGBA1C 7.2 (H) 04/30/2021    Latest Reference Range & Units 12/03/21 09:10  Sodium 135 - 145 mEq/L 142  Potassium 3.5 - 5.1 mEq/L 4.5  Chloride 96 - 112 mEq/L 106  CO2 19 - 32 mEq/L 28  Glucose 70 - 99 mg/dL 12/05/21 (H)  BUN 6 - 23 mg/dL 20  Creatinine 154 - 0.08 mg/dL 6.76  Calcium 8.4 - 1.95 mg/dL  9.3  Alkaline Phosphatase 39 - 117 U/L 105  Albumin 3.5 - 5.2 g/dL 4.2  AST 0 - 37 U/L 16  ALT 0 - 35 U/L 18  Total Protein 6.0 - 8.3 g/dL 7.2  Total Bilirubin 0.2 - 1.2 mg/dL 0.7  GFR >24.58 mL/min 64.11  Total CHOL/HDL Ratio  3  Cholesterol 0 - 200 mg/dL 099  HDL Cholesterol >83.38 mg/dL 25.05  LDL (calc) 0 - 99 mg/dL 65  MICROALB/CREAT RATIO 0.0 - 30.0 mg/g 0.9  NonHDL  92.61  Triglycerides 0.0 - 149.0 mg/dL 397.6  VLDL 0.0 - 73.4 mg/dL 19.3     Latest Reference Range & Units 12/03/21 09:10  Creatinine,U mg/dL 79.0  Microalb, Ur 0.0 - 1.9 mg/dL <2.4  MICROALB/CREAT RATIO 0.0 - 30.0 mg/g 0.9     ASSESSMENT / PLAN / RECOMMENDATIONS:   1) Type 2 Diabetes Mellitus, Optimally controlled, Without complications - Most recent A1c of 6.9 %. Goal A1c < 7.0 %.    - Praised the pt on improved glycemic control  - Intolerant to Jardiance due to recurrent yeast infections  - She is intolerant to Trulicity due to nausea. No hx of pancreatitis  - She has occasional GI symptoms with Ozempic a dn we opted not to increase the dose yet  - Will reduce Levemir due to overnight hypoglycemia  - Will try Dexcom as some of her Bg's readings on freestyle libre may not be as accurate  - Emphasized the importance of continued CHO reduction with meals as she has been noted with postprandial hyperglycemia > 200 mg/dL    MEDICATIONS:  - Decrease Levemir 30 units daily  - Continue Ozempic 0.5 mg weekly       EDUCATION / INSTRUCTIONS: BG monitoring instructions: Patient is instructed to check her blood sugars 3 times a day, before meals . Call [facility] if: BG persistently < 70  I reviewed the Rule of 15 for the treatment of hypoglycemia in detail with the patient. Literature supplied.   2) Diabetic complications:  Eye: Does not have known diabetic retinopathy.  Neuro/ Feet: Does not have known diabetic peripheral neuropathy. Renal: Patient does not have known baseline CKD. She is  on an ACEI/ARB at present.       Follow-up in 6 months       Signed electronically by: Lyndle Herrlich, MD  [facility]  [facility] [address] [place] [place], [place] [address] Phone: [phone] FAX: [phone]   CC: Etta Grandchild, MD [address] [place] [address] Phone: [phone]  Fax: [phone]    Return to Endocrinology clinic as below: Future Appointments  Date Time Provider Department Center  04/01/2022  8:00 AM [facility] 2 [facility]  07/25/2022  8:00 AM Etta Grandchild, MD [facility] None

## 2022-04-01 ENCOUNTER — Ambulatory Visit
Admission: RE | Admit: 2022-04-01 | Discharge: 2022-04-01 | Disposition: A | Discharge status: Home / Self Care | Payer: 59 | Source: Ambulatory Visit

## 2022-04-01 DIAGNOSIS — Z1231 Encounter for screening mammogram for malignant neoplasm of breast: Secondary | ICD-10-CM

## 2022-04-10 ENCOUNTER — Other Ambulatory Visit (HOSPITAL_COMMUNITY): Payer: Self-pay

## 2022-04-11 ENCOUNTER — Other Ambulatory Visit (HOSPITAL_COMMUNITY): Payer: Self-pay

## 2022-04-16 ENCOUNTER — Other Ambulatory Visit: Payer: Self-pay | Admitting: Family

## 2022-04-16 DIAGNOSIS — E119 Type 2 diabetes mellitus without complications: Secondary | ICD-10-CM

## 2022-04-24 ENCOUNTER — Other Ambulatory Visit: Payer: Self-pay | Admitting: Internal Medicine

## 2022-04-24 ENCOUNTER — Other Ambulatory Visit (HOSPITAL_COMMUNITY): Payer: Self-pay

## 2022-04-24 ENCOUNTER — Other Ambulatory Visit: Payer: Self-pay | Admitting: Family

## 2022-04-24 DIAGNOSIS — E119 Type 2 diabetes mellitus without complications: Secondary | ICD-10-CM

## 2022-04-24 MED ORDER — LOSARTAN POTASSIUM 25 MG PO TABS
25.0000 mg | ORAL_TABLET | Freq: Every day | ORAL | 1 refills | Status: DC
  Filled 2022-04-24: qty 30, 30d supply, fill #0
  Filled 2022-05-28: qty 30, 30d supply, fill #1
  Filled 2022-06-24: qty 30, 30d supply, fill #2
  Filled 2022-07-18: qty 90, 90d supply, fill #3

## 2022-05-03 ENCOUNTER — Other Ambulatory Visit (HOSPITAL_COMMUNITY): Payer: Self-pay

## 2022-05-28 ENCOUNTER — Other Ambulatory Visit (HOSPITAL_COMMUNITY): Payer: Self-pay

## 2022-05-29 ENCOUNTER — Other Ambulatory Visit (HOSPITAL_COMMUNITY): Payer: Self-pay

## 2022-06-11 ENCOUNTER — Other Ambulatory Visit (HOSPITAL_COMMUNITY): Payer: Self-pay

## 2022-06-11 ENCOUNTER — Ambulatory Visit
Admission: RE | Admit: 2022-06-11 | Discharge: 2022-06-11 | Disposition: A | Discharge status: Home / Self Care | Payer: 59 | Source: Ambulatory Visit | Attending: Urgent Care | Admitting: Urgent Care

## 2022-06-11 VITALS — BP 122/85 | HR 76 | Temp 98.2°F | Resp 20

## 2022-06-11 DIAGNOSIS — Z794 Long term (current) use of insulin: Secondary | ICD-10-CM

## 2022-06-11 DIAGNOSIS — E119 Type 2 diabetes mellitus without complications: Secondary | ICD-10-CM | POA: Diagnosis not present

## 2022-06-11 DIAGNOSIS — J019 Acute sinusitis, unspecified: Secondary | ICD-10-CM

## 2022-06-11 MED ORDER — LEVOCETIRIZINE DIHYDROCHLORIDE 5 MG PO TABS
5.0000 mg | ORAL_TABLET | Freq: Every evening | ORAL | 0 refills | Status: DC
  Filled 2022-06-11 (×2): qty 30, 30d supply, fill #0

## 2022-06-11 MED ORDER — PROMETHAZINE-DM 6.25-15 MG/5ML PO SYRP
5.0000 mL | ORAL_SOLUTION | Freq: Every evening | ORAL | 0 refills | Status: DC | PRN
  Filled 2022-06-11: qty 100, 20d supply, fill #0

## 2022-06-11 MED ORDER — AMOXICILLIN-POT CLAVULANATE 875-125 MG PO TABS
1.0000 | ORAL_TABLET | Freq: Two times a day (BID) | ORAL | 0 refills | Status: DC
  Filled 2022-06-11: qty 14, 7d supply, fill #0

## 2022-06-11 NOTE — ED Triage Notes (Signed)
Pt here with dry, persistent cough and bilateral otalgia x 10 days.

## 2022-06-11 NOTE — ED Provider Notes (Signed)
[facility]   MRN: [id number] DOB: 1965/12/05  Subjective:   Shelly Whitehead is a 56 y.o. female presenting for acute on chronic sinus congestion, bilateral otalgia, postnasal drainage, persistent hacking cough that is worse at night.  Reports that she is actually had months of sinus issues.  Has not undergone an antibiotic course.  No history of asthma or COPD, smoking.  She does have a history of SVT, diabetes managed with insulin.  No current facility-administered medications for this encounter.  Current Outpatient Medications:    atorvastatin (LIPITOR) 20 MG tablet, Take 1 tablet (20 mg total) by mouth daily., Disp: 90 tablet, Rfl: 1   Continuous Blood Gluc Sensor (DEXCOM G7 SENSOR) MISC, Apply 1 sensor every 10 days., Disp: 9 each, Rfl: 3   Continuous Blood Gluc Sensor (FREESTYLE LIBRE 14 DAY SENSOR) MISC, 1 Device by Other route every 14 (fourteen) days., Disp: 6 each, Rfl: 3   insulin detemir (LEVEMIR FLEXPEN) 100 UNIT/ML FlexPen, Inject 24 Units into the skin daily., Disp: 30 mL, Rfl: 4   Insulin Pen Needle 31G X 5 MM MISC, Use as directed., Disp: 100 each, Rfl: 3   losartan (COZAAR) 25 MG tablet, TAKE 1 TABLET BY MOUTH EVERY DAY, Disp: 90 tablet, Rfl: 1   metoprolol succinate (TOPROL-XL) 50 MG 24 hr tablet, Take 1 tablet (50 mg total) by mouth daily., Disp: 90 tablet, Rfl: 1   Semaglutide,0.25 or 0.5MG /DOS, (OZEMPIC, 0.25 OR 0.5 MG/DOSE,) 2 MG/3ML SOPN, Inject 0.5 mg into the skin once a week., Disp: 9 mL, Rfl: 3   Allergies  Allergen Reactions   Metformin Diarrhea and Nausea Only   Oxycodone Itching   Oxycodone-Acetaminophen Itching    Past Medical History:  Diagnosis Date   Adhesive capsulitis of right shoulder    ASCUS of cervix with negative high risk HPV    Diabetes mellitus without complication (HCC)    Migraine with aura and with status migrainosus, not intractable    Postmenopausal bleeding    SVT (supraventricular tachycardia) (HCC)       Past Surgical History:  Procedure Laterality Date   APPENDECTOMY     GALLBLADDER SURGERY  1998   SHOULDER SURGERY  2016    Family History  Problem Relation Age of Onset   Alzheimer's disease Mother    Cancer Father        Bladder    Social History   Tobacco Use   Smoking status: Never   Smokeless tobacco: Never  Vaping Use   Vaping Use: Never used  Substance Use Topics   Alcohol use: Yes    Alcohol/week: 3.0 standard drinks of alcohol    Types: 3 Cans of beer per week   Drug use: Never    ROS   Objective:   Vitals: BP 122/85   Pulse 76   Temp 98.2 F (36.8 C)   Resp 20   SpO2 98%   Physical Exam Constitutional:      General: She is not in acute distress.    Appearance: Normal appearance. She is well-developed and normal weight. She is not ill-appearing, toxic-appearing or diaphoretic.  HENT:     Head: Normocephalic and atraumatic.     Right Ear: Tympanic membrane, ear canal and external ear normal. No drainage or tenderness. No middle ear effusion. There is no impacted cerumen. Tympanic membrane is not erythematous.     Left Ear: Tympanic membrane, ear canal and external ear normal. No drainage or tenderness.  No middle  ear effusion. There is no impacted cerumen. Tympanic membrane is not erythematous.     Nose: Congestion present. No rhinorrhea.     Mouth/Throat:     Mouth: Mucous membranes are moist. No oral lesions.     Pharynx: No pharyngeal swelling, oropharyngeal exudate, posterior oropharyngeal erythema or uvula swelling.     Tonsils: No tonsillar exudate or tonsillar abscesses.     Comments: Thick streaks of postnasal drainage overlying pharynx. Eyes:     General: No scleral icterus.       Right eye: No discharge.        Left eye: No discharge.     Extraocular Movements: Extraocular movements intact.     Right eye: Normal extraocular motion.     Left eye: Normal extraocular motion.     Conjunctiva/sclera: Conjunctivae normal.  Cardiovascular:      Rate and Rhythm: Normal rate and regular rhythm.     Heart sounds: Normal heart sounds. No murmur heard.    No friction rub. No gallop.  Pulmonary:     Effort: Pulmonary effort is normal. No respiratory distress.     Breath sounds: No stridor. No wheezing, rhonchi or rales.  Chest:     Chest wall: No tenderness.  Musculoskeletal:     Cervical back: Normal range of motion and neck supple.  Lymphadenopathy:     Cervical: No cervical adenopathy.  Skin:    General: Skin is warm and dry.  Neurological:     General: No focal deficit present.     Mental Status: She is alert and oriented to person, place, and time.  Psychiatric:        Mood and Affect: Mood normal.        Behavior: Behavior normal.     Assessment and Plan :   PDMP not reviewed this encounter.  1. Acute non-recurrent sinusitis, unspecified location   2. Type 2 diabetes mellitus treated with insulin (HCC)    Will start empiric treatment for sinusitis with Augmentin.  Recommended supportive care otherwise including the use of oral antihistamine, cough syrup as needed. Deferred imaging given clear cardiopulmonary exam, hemodynamically stable vital signs. We will defer steroid use for now given her diabetes. Counseled patient on potential for adverse effects with medications prescribed/recommended today, ER and return-to-clinic precautions discussed, patient verbalized understanding.    Wallis Bamberg, PA-C 06/11/22 1359

## 2022-06-19 ENCOUNTER — Encounter: Payer: Self-pay | Admitting: Internal Medicine

## 2022-06-24 ENCOUNTER — Other Ambulatory Visit (HOSPITAL_COMMUNITY): Payer: Self-pay

## 2022-06-26 ENCOUNTER — Other Ambulatory Visit (HOSPITAL_COMMUNITY): Payer: Self-pay

## 2022-06-26 ENCOUNTER — Other Ambulatory Visit: Payer: Self-pay | Admitting: Internal Medicine

## 2022-06-26 MED ORDER — SEMAGLUTIDE (1 MG/DOSE) 4 MG/3ML ~~LOC~~ SOPN
1.0000 mg | PEN_INJECTOR | SUBCUTANEOUS | 3 refills | Status: DC
  Filled 2022-06-26: qty 3, 28d supply, fill #0
  Filled 2022-07-18: qty 3, 28d supply, fill #1
  Filled 2022-08-19: qty 3, 28d supply, fill #2
  Filled 2022-09-19: qty 3, 28d supply, fill #3
  Filled 2022-10-16: qty 3, 28d supply, fill #4
  Filled 2022-11-05 – 2022-11-08 (×2): qty 3, 28d supply, fill #5
  Filled 2022-12-06: qty 3, 28d supply, fill #6
  Filled 2023-01-04: qty 3, 28d supply, fill #7
  Filled 2023-01-31: qty 3, 28d supply, fill #8

## 2022-07-05 ENCOUNTER — Other Ambulatory Visit (HOSPITAL_COMMUNITY): Payer: Self-pay

## 2022-07-18 ENCOUNTER — Other Ambulatory Visit: Payer: Self-pay | Admitting: Internal Medicine

## 2022-07-18 DIAGNOSIS — E785 Hyperlipidemia, unspecified: Secondary | ICD-10-CM

## 2022-07-19 ENCOUNTER — Other Ambulatory Visit (HOSPITAL_COMMUNITY): Payer: Self-pay

## 2022-07-19 MED ORDER — ATORVASTATIN CALCIUM 20 MG PO TABS
20.0000 mg | ORAL_TABLET | Freq: Every day | ORAL | 1 refills | Status: DC
  Filled 2022-07-19: qty 90, 90d supply, fill #0
  Filled 2022-08-19: qty 30, 30d supply, fill #1
  Filled 2022-09-19: qty 30, 30d supply, fill #2
  Filled 2022-11-05 – 2022-12-18 (×2): qty 30, 30d supply, fill #3

## 2022-07-22 ENCOUNTER — Encounter: Payer: Self-pay | Admitting: Internal Medicine

## 2022-07-25 ENCOUNTER — Other Ambulatory Visit (HOSPITAL_COMMUNITY): Payer: Self-pay

## 2022-07-25 ENCOUNTER — Encounter: Payer: Self-pay | Admitting: Internal Medicine

## 2022-07-25 ENCOUNTER — Other Ambulatory Visit: Payer: Self-pay | Admitting: Internal Medicine

## 2022-07-25 ENCOUNTER — Ambulatory Visit (INDEPENDENT_AMBULATORY_CARE_PROVIDER_SITE_OTHER): Payer: 59 | Admitting: Internal Medicine

## 2022-07-25 VITALS — BP 122/72 | HR 85 | Temp 97.9°F | Ht 67.0 in | Wt 216.0 lb

## 2022-07-25 DIAGNOSIS — E119 Type 2 diabetes mellitus without complications: Secondary | ICD-10-CM

## 2022-07-25 DIAGNOSIS — Z23 Encounter for immunization: Secondary | ICD-10-CM

## 2022-07-25 DIAGNOSIS — Z0001 Encounter for general adult medical examination with abnormal findings: Secondary | ICD-10-CM

## 2022-07-25 DIAGNOSIS — N182 Chronic kidney disease, stage 2 (mild): Secondary | ICD-10-CM | POA: Insufficient documentation

## 2022-07-25 DIAGNOSIS — Z794 Long term (current) use of insulin: Secondary | ICD-10-CM

## 2022-07-25 DIAGNOSIS — E785 Hyperlipidemia, unspecified: Secondary | ICD-10-CM

## 2022-07-25 LAB — BASIC METABOLIC PANEL
BUN: 24 mg/dL — ABNORMAL HIGH (ref 6–23)
CO2: 28 mEq/L (ref 19–32)
Calcium: 9.3 mg/dL (ref 8.4–10.5)
Chloride: 107 mEq/L (ref 96–112)
Creatinine, Ser: 1.14 mg/dL (ref 0.40–1.20)
GFR: 53.88 mL/min — ABNORMAL LOW (ref >60.00)
Glucose, Bld: 117 mg/dL — ABNORMAL HIGH (ref 70–99)
Potassium: 4.5 mEq/L (ref 3.5–5.1)
Sodium: 141 mEq/L (ref 135–145)

## 2022-07-25 LAB — HEMOGLOBIN A1C: Hgb A1c MFr Bld: 6.8 % — ABNORMAL HIGH (ref 4.6–6.5)

## 2022-07-25 MED ORDER — DAPAGLIFLOZIN PROPANEDIOL 10 MG PO TABS
10.0000 mg | ORAL_TABLET | Freq: Every day | ORAL | 1 refills | Status: DC
  Filled 2022-07-25: qty 90, 90d supply, fill #0

## 2022-07-25 NOTE — Progress Notes (Unsigned)
Subjective:  Patient ID: Shelly Whitehead, female    DOB: [birth year]  Age: 56 y.o. MRN: [id number]  CC: Diabetes and Annual Exam   HPI Shelly Whitehead presents for a CPX and f/up -  She walks about 4 miles a day and does not experience chest pain, shortness of breath, diaphoresis, or edema.  Outpatient Medications Prior to Visit  Medication Sig Dispense Refill   atorvastatin (LIPITOR) 20 MG tablet Take 1 tablet (20 mg total) by mouth daily. 90 tablet 1   Continuous Blood Gluc Sensor (DEXCOM G7 SENSOR) MISC Apply 1 sensor every 10 days. 9 each 3   insulin detemir (LEVEMIR FLEXPEN) 100 UNIT/ML FlexPen Inject 24 Units into the skin daily. 30 mL 4   Insulin Pen Needle 31G X 5 MM MISC Use as directed. 100 each 3   losartan (COZAAR) 25 MG tablet TAKE 1 TABLET BY MOUTH EVERY DAY 90 tablet 1   metoprolol succinate (TOPROL-XL) 50 MG 24 hr tablet Take 1 tablet by mouth daily. 90 tablet 0   Semaglutide, 1 MG/DOSE, 4 MG/3ML SOPN Inject 1 mg as directed once a week. 9 mL 3   amoxicillin-clavulanate (AUGMENTIN) 875-125 MG tablet Take 1 tablet by mouth 2 times daily. 14 tablet 0   Continuous Blood Gluc Sensor (FREESTYLE LIBRE 14 DAY SENSOR) MISC 1 Device by Other route every 14 (fourteen) days. 6 each 3   levocetirizine (XYZAL) 5 MG tablet Take 1 tablet by mouth every evening. 30 tablet 0   promethazine-dextromethorphan (PROMETHAZINE-DM) 6.25-15 MG/5ML syrup Take 5 mLs by mouth at bedtime as needed for cough. 100 mL 0   No facility-administered medications prior to visit.    ROS Review of Systems  Constitutional: Negative.  Negative for diaphoresis and fatigue.  HENT: Negative.    Eyes: Negative.   Respiratory:  Negative for cough, chest tightness and wheezing.   Cardiovascular:  Negative for chest pain, palpitations and leg swelling.  Gastrointestinal:  Negative for abdominal pain, constipation, diarrhea and vomiting.  Endocrine: Negative.   Genitourinary: Negative.   Musculoskeletal:  Negative  for arthralgias and myalgias.  Skin: Negative.  Negative for color change.  Neurological: Negative.  Negative for dizziness, weakness and light-headedness.  Hematological:  Negative for adenopathy. Does not bruise/bleed easily.  Psychiatric/Behavioral: Negative.      Objective:  BP 122/72 (BP Location: Left Arm, Patient Position: Sitting, Cuff Size: Large)   Pulse 85   Temp 97.9 F (36.6 C) (Oral)   Ht 5\' 7"  (1.702 m)   Wt 216 lb (98 kg)   SpO2 97%   BMI 33.83 kg/m   BP Readings from Last 3 Encounters:  07/25/22 122/72  06/11/22 122/85  03/29/22 130/74    Wt Readings from Last 3 Encounters:  07/25/22 216 lb (98 kg)  03/29/22 228 lb (103.4 kg)  01/22/22 233 lb (105.7 kg)    Physical Exam Vitals reviewed.  HENT:     Mouth/Throat:     Mouth: Mucous membranes are moist.  Eyes:     General: No scleral icterus.    Conjunctiva/sclera: Conjunctivae normal.  Cardiovascular:     Rate and Rhythm: Normal rate and regular rhythm.     Heart sounds: No murmur heard. Pulmonary:     Effort: Pulmonary effort is normal.     Breath sounds: No stridor. No wheezing, rhonchi or rales.  Abdominal:     General: Abdomen is flat.     Palpations: There is no mass.     Tenderness: There is no  abdominal tenderness. There is no guarding.     Hernia: No hernia is present.  Musculoskeletal:        General: Normal range of motion.     Cervical back: Neck supple.     Right lower leg: No edema.     Left lower leg: No edema.  Lymphadenopathy:     Cervical: No cervical adenopathy.  Skin:    General: Skin is warm and dry.  Neurological:     General: No focal deficit present.     Mental Status: She is alert.  Psychiatric:        Mood and Affect: Mood normal.        Behavior: Behavior normal.     Lab Results  Component Value Date   WBC 4.6 04/30/2021   HGB 15.2 04/30/2021   HCT 45.7 (H) 04/30/2021   PLT 232 04/30/2021   GLUCOSE 117 (H) 07/25/2022   CHOL 145 12/03/2021   TRIG 136.0  12/03/2021   HDL 52.60 12/03/2021   LDLCALC 65 12/03/2021   ALT 18 12/03/2021   AST 16 12/03/2021   NA 141 07/25/2022   K 4.5 07/25/2022   CL 107 07/25/2022   CREATININE 1.14 07/25/2022   BUN 24 (H) 07/25/2022   CO2 28 07/25/2022   TSH 2.50 04/30/2021   HGBA1C 6.8 (H) 07/25/2022   MICROALBUR <0.7 12/03/2021    No results found.  Assessment & Plan:   Shelly Whitehead was seen today for diabetes and annual exam.  Diagnoses and all orders for this visit:  Type 2 diabetes mellitus without complication, with long-term current use of insulin (Shelly Whitehead)- Her blood sugar is adequately well controlled. -     Basic metabolic panel; Future -     Hemoglobin A1c; Future -     Hemoglobin A1c -     Basic metabolic panel -     Cancel: CT CARDIAC SCORING (SELF PAY ONLY); Future -     CT CARDIAC SCORING ([facility] LOCATIONS ONLY); Future -     Discontinue: dapagliflozin propanediol (FARXIGA) 10 MG TABS tablet; Take 1 tablet (10 mg total) by mouth daily before breakfast. -     KERENDIA 10 MG TABS; Take 1 tablet by mouth daily. -     Basic metabolic panel; Future  Hyperlipidemia LDL goal <100- LDL goal achieved. Doing well on the statin  -     Cancel: CT CARDIAC SCORING (SELF PAY ONLY); Future -     CT CARDIAC SCORING ([facility] LOCATIONS ONLY); Future  Chronic renal disease, stage 2, mildly decreased glomerular filtration rate (GFR) between 60-89 mL/min/1.73 square meter- She does not tolerate SGLT2 inhibitors so I recommended that she start taking a mineralocorticoid antagonist. -     Discontinue: dapagliflozin propanediol (FARXIGA) 10 MG TABS tablet; Take 1 tablet (10 mg total) by mouth daily before breakfast. -     KERENDIA 10 MG TABS; Take 1 tablet by mouth daily. -     Basic metabolic panel; Future  Encounter for general adult medical examination with abnormal findings- Exam completed, labs reviewed, vaccines reviewed and updated, cancer screenings are up-to-date, patient education was given.  Other orders -      Flu Vaccine QUAD 6+ mos PF IM (Fluarix Quad PF)   I have discontinued Shelly Whitehead's FreeStyle Libre 14 Day Sensor, amoxicillin-clavulanate, promethazine-dextromethorphan, levocetirizine, and dapagliflozin propanediol. I am also having her start on Kerendia. Additionally, I am having her maintain her Insulin Pen Needle, metoprolol succinate, Levemir FlexPen, Dexcom G7 Sensor, losartan, Semaglutide (  1 MG/DOSE), and atorvastatin.  Meds ordered this encounter  Medications   DISCONTD: dapagliflozin propanediol (FARXIGA) 10 MG TABS tablet    Sig: Take 1 tablet (10 mg total) by mouth daily before breakfast.    Dispense:  90 tablet    Refill:  1   KERENDIA 10 MG TABS    Sig: Take 1 tablet by mouth daily.    Dispense:  30 tablet    Refill:  0     Follow-up: No follow-ups on file.  Sanda Linger, MD

## 2022-07-26 ENCOUNTER — Other Ambulatory Visit (HOSPITAL_COMMUNITY): Payer: Self-pay

## 2022-07-27 ENCOUNTER — Encounter: Payer: Self-pay | Admitting: Internal Medicine

## 2022-07-29 ENCOUNTER — Other Ambulatory Visit (HOSPITAL_COMMUNITY): Payer: Self-pay

## 2022-07-29 MED ORDER — KERENDIA 10 MG PO TABS
1.0000 | ORAL_TABLET | Freq: Every day | ORAL | 0 refills | Status: DC
  Filled 2022-07-29: qty 30, 30d supply, fill #0

## 2022-07-30 ENCOUNTER — Other Ambulatory Visit (HOSPITAL_COMMUNITY): Payer: Self-pay

## 2022-07-31 ENCOUNTER — Other Ambulatory Visit (HOSPITAL_COMMUNITY): Payer: Self-pay

## 2022-08-02 ENCOUNTER — Other Ambulatory Visit (HOSPITAL_COMMUNITY): Payer: Self-pay

## 2022-08-04 ENCOUNTER — Encounter: Payer: Self-pay | Admitting: Internal Medicine

## 2022-08-05 ENCOUNTER — Other Ambulatory Visit: Payer: Self-pay | Admitting: Internal Medicine

## 2022-08-19 ENCOUNTER — Other Ambulatory Visit (HOSPITAL_COMMUNITY): Payer: Self-pay

## 2022-08-19 ENCOUNTER — Other Ambulatory Visit: Payer: Self-pay | Admitting: Internal Medicine

## 2022-08-19 DIAGNOSIS — E119 Type 2 diabetes mellitus without complications: Secondary | ICD-10-CM

## 2022-08-19 MED ORDER — LOSARTAN POTASSIUM 25 MG PO TABS
25.0000 mg | ORAL_TABLET | Freq: Every day | ORAL | 1 refills | Status: DC
  Filled 2022-08-19: qty 30, 30d supply, fill #0
  Filled 2022-09-19: qty 30, 30d supply, fill #1
  Filled 2022-11-05 – 2022-12-18 (×2): qty 30, 30d supply, fill #2
  Filled 2023-01-04 – 2023-01-14 (×2): qty 30, 30d supply, fill #3
  Filled 2023-02-28: qty 30, 30d supply, fill #4
  Filled 2023-03-30: qty 30, 30d supply, fill #5

## 2022-09-11 ENCOUNTER — Other Ambulatory Visit (HOSPITAL_COMMUNITY): Payer: Self-pay

## 2022-09-19 ENCOUNTER — Other Ambulatory Visit: Payer: Self-pay | Admitting: Internal Medicine

## 2022-09-19 ENCOUNTER — Other Ambulatory Visit (HOSPITAL_COMMUNITY): Payer: Self-pay

## 2022-09-19 DIAGNOSIS — I471 Supraventricular tachycardia, unspecified: Secondary | ICD-10-CM

## 2022-09-19 MED ORDER — METOPROLOL SUCCINATE ER 50 MG PO TB24
50.0000 mg | ORAL_TABLET | Freq: Every day | ORAL | 0 refills | Status: DC
  Filled 2022-09-19: qty 30, 30d supply, fill #0
  Filled 2022-10-16: qty 30, 30d supply, fill #1
  Filled 2022-11-05 – 2022-11-13 (×2): qty 30, 30d supply, fill #2

## 2022-09-20 ENCOUNTER — Other Ambulatory Visit (HOSPITAL_COMMUNITY): Payer: Self-pay

## 2022-09-27 ENCOUNTER — Other Ambulatory Visit (HOSPITAL_COMMUNITY): Payer: Self-pay

## 2022-09-27 MED ORDER — AMOXICILLIN 875 MG PO TABS
875.0000 mg | ORAL_TABLET | Freq: Two times a day (BID) | ORAL | 0 refills | Status: DC
  Filled 2022-09-27: qty 6, 3d supply, fill #0

## 2022-09-27 MED ORDER — CHLORHEXIDINE GLUCONATE 0.12 % MT SOLN
OROMUCOSAL | 0 refills | Status: DC
  Filled 2022-09-27: qty 473, 7d supply, fill #0

## 2022-09-30 ENCOUNTER — Ambulatory Visit: Payer: 59 | Admitting: Internal Medicine

## 2022-10-10 ENCOUNTER — Other Ambulatory Visit (HOSPITAL_COMMUNITY): Payer: Self-pay

## 2022-10-14 ENCOUNTER — Other Ambulatory Visit: Payer: 59

## 2022-10-16 ENCOUNTER — Other Ambulatory Visit: Payer: Self-pay

## 2022-10-16 ENCOUNTER — Other Ambulatory Visit (HOSPITAL_COMMUNITY): Payer: Self-pay

## 2022-10-17 ENCOUNTER — Other Ambulatory Visit (HOSPITAL_COMMUNITY): Payer: Self-pay

## 2022-10-17 ENCOUNTER — Encounter: Payer: Self-pay | Admitting: Internal Medicine

## 2022-10-17 ENCOUNTER — Ambulatory Visit (INDEPENDENT_AMBULATORY_CARE_PROVIDER_SITE_OTHER): Payer: 59 | Admitting: Internal Medicine

## 2022-10-17 VITALS — BP 122/80 | HR 78 | Ht 67.0 in | Wt 218.0 lb

## 2022-10-17 DIAGNOSIS — Z794 Long term (current) use of insulin: Secondary | ICD-10-CM | POA: Diagnosis not present

## 2022-10-17 DIAGNOSIS — E119 Type 2 diabetes mellitus without complications: Secondary | ICD-10-CM | POA: Diagnosis not present

## 2022-10-17 LAB — BASIC METABOLIC PANEL
BUN: 17 mg/dL (ref 6–23)
CO2: 29 mEq/L (ref 19–32)
Calcium: 9.1 mg/dL (ref 8.4–10.5)
Chloride: 105 mEq/L (ref 96–112)
Creatinine, Ser: 1.06 mg/dL (ref 0.40–1.20)
GFR: 58.7 mL/min — ABNORMAL LOW (ref >60.00)
Glucose, Bld: 105 mg/dL — ABNORMAL HIGH (ref 70–99)
Potassium: 4.5 mEq/L (ref 3.5–5.1)
Sodium: 140 mEq/L (ref 135–145)

## 2022-10-17 LAB — POCT GLYCOSYLATED HEMOGLOBIN (HGB A1C): Hemoglobin A1C: 6.1 % — AB (ref 4.0–5.6)

## 2022-10-17 MED ORDER — LANTUS SOLOSTAR 100 UNIT/ML ~~LOC~~ SOPN
20.0000 [IU] | PEN_INJECTOR | Freq: Every day | SUBCUTANEOUS | 3 refills | Status: DC
  Filled 2022-10-17: qty 6, 30d supply, fill #0
  Filled 2022-11-05 – 2022-11-14 (×3): qty 6, 30d supply, fill #1
  Filled 2022-12-11: qty 6, 30d supply, fill #2
  Filled 2023-01-04: qty 6, 30d supply, fill #3
  Filled 2023-02-06: qty 6, 30d supply, fill #4
  Filled 2023-03-08: qty 6, 30d supply, fill #5
  Filled 2023-04-11: qty 6, 30d supply, fill #6

## 2022-10-17 MED ORDER — INSULIN PEN NEEDLE 31G X 5 MM MISC
1.0000 | Freq: Four times a day (QID) | 3 refills | Status: DC
  Filled 2022-10-17: qty 200, 30d supply, fill #0
  Filled 2022-11-12: qty 100, 30d supply, fill #0
  Filled 2023-02-06: qty 100, 30d supply, fill #1
  Filled 2023-03-16: qty 100, 30d supply, fill #2

## 2022-10-17 NOTE — Patient Instructions (Signed)
-    Switch levemir to Lantus 16  units daily  - Continue Ozempic 1 mg weekly   Fiasp correctional insulin ( While off Ozempic) : Use the scale below to help guide you BEFORE each meal   Blood sugar before meal Number of units to inject  Less than 160 0 unit  161 -  190 1 units  191 -  220 2 units  221 -  250 3 units  251 -  280 4 units  281 -  310 5 units  311 -  340 6 units    HOW TO TREAT LOW BLOOD SUGARS (Blood sugar LESS THAN 70 MG/DL) Please follow the RULE OF 15 for the treatment of hypoglycemia treatment (when your (blood sugars are less than 70 mg/dL)   STEP 1: Take 15 grams of carbohydrates when your blood sugar is low, which includes:  3-4 GLUCOSE TABS  OR 3-4 OZ OF JUICE OR REGULAR SODA OR ONE TUBE OF GLUCOSE GEL    STEP 2: RECHECK blood sugar in 15 MINUTES STEP 3: If your blood sugar is still low at the 15 minute recheck --> then, go back to STEP 1 and treat AGAIN with another 15 grams of carbohydrates.

## 2022-10-17 NOTE — Progress Notes (Signed)
Name: Shelly Whitehead  MRN/ DOB: [id number], 04/11/1966   Age/ Sex: 56 y.o., female    PCP: Etta Grandchild, MD   Reason for Endocrinology Evaluation: Type 2 Diabetes Mellitus     Date of Initial Endocrinology Visit: 12/03/2021    PATIENT IDENTIFIER: [name] is a 56 y.o. female with a past medical history of T2DM, dyslipidemia, Hx of PCOS and migraine headaches. The patient presented for initial endocrinology clinic visit on 12/03/2021 for consultative assistance with her diabetes management.    HPI: [name] was    Diagnosed with DM 2015 Prior Medications tried/Intolerance: Metformin- diarrhea . Bydureon - renal function was low ? But no side effects . Trulicity -nausea                 Hemoglobin A1c has ranged from 6.6% in 2019, peaking at 7.5% in 2021.     On her initial visit to our clinic she had an A1c of 7.8%, she was on Levemir and Jardiance but she was having monthly yeast infection requiring monthly Diflucan prescriptions.  We stopped Jardiance and started Ozempic  She stopped Ozempic by September 2023 because she was under the impression that this was hurting her kidneys   SUBJECTIVE:   During the last visit (03/29/2022): A1c 6.9 %      Today (10/17/22): Shelly Whitehead is here for follow-up on diabetes management.  She checks her blood sugars multiple times daily, through Dexcom . The patient has  had hypoglycemic episodes since the last clinic visit, which typically occur at night    She is going to have tooth extraction with implant  through oral surgery but need to be off Ozempic for at least a month prior to the sx to prevent aspiration  Insurance is discontinuing Levemir would like to switch to Lantus  Denies vomiting or diarrhea     HOME DIABETES REGIMEN: Levemir 20 units daily  Ozempic 1 mg weekly        Statin: yes ACE-I/ARB: yes Prior Diabetic Education: yes    CONTINUOUS GLUCOSE MONITORING RECORD INTERPRETATION    Dates of  Recording: 11/24-12/05/2022  Sensor description: dexcom  Results statistics:   CGM use % of time 71  Average and SD 137/28  Time in range  91    %  % Time Above 180 8  % Time above 250 <1  % Time Below target 0      Glycemic patterns summary: BG's optimal at night , hyperglycemia noted after meals  Hyperglycemic episodes  postprandial  Hypoglycemic episodes occurred N/A  Overnight periods: stable      DIABETIC COMPLICATIONS: Microvascular complications:   Denies: CKD, retinopathy, neuropathy  Last eye exam: Completed 11/2021  Macrovascular complications:   Denies: CAD, PVD, CVA   PAST HISTORY: Past Medical History:  Past Medical History:  Diagnosis Date   Adhesive capsulitis of right shoulder    ASCUS of cervix with negative high risk HPV    Diabetes mellitus without complication (HCC)    Migraine with aura and with status migrainosus, not intractable    Postmenopausal bleeding    SVT (supraventricular tachycardia)    Past Surgical History:  Past Surgical History:  Procedure Laterality Date   APPENDECTOMY     GALLBLADDER SURGERY  1998   SHOULDER SURGERY  2016    Social History:  reports that she has never smoked. She has never used smokeless tobacco. She reports current alcohol use of about 3.0 standard drinks of alcohol per week.  She reports that she does not use drugs. Family History:  Family History  Problem Relation Age of Onset   Alzheimer's disease Mother    Cancer Father        Bladder     HOME MEDICATIONS: Allergies as of 10/17/2022       Reactions   Jardiance [empagliflozin] Other (See Comments)   Yeast infection   Metformin Diarrhea, Nausea Only   Oxycodone Itching   Oxycodone-acetaminophen Itching        Medication List        Accurate as of October 17, 2022 11:32 AM. If you have any questions, ask your nurse or doctor.          amoxicillin 875 MG tablet Commonly known as: AMOXIL Take 1 tablet (875 mg total) by mouth  every 12 (twelve) hours for three days, start 1 day before surgery   atorvastatin 20 MG tablet Commonly known as: LIPITOR Take 1 tablet (20 mg total) by mouth daily.   chlorhexidine 0.12 % solution Commonly known as: Peridex Rinse 1/2 oz. twice daily. start one day prior to surgery then use for one week after.   Dexcom G7 Sensor Misc Apply 1 sensor every 10 days.   Levemir FlexPen 100 UNIT/ML FlexPen Generic drug: insulin detemir Inject 24 Units into the skin daily. What changed: how much to take   losartan 25 MG tablet Commonly known as: COZAAR Take 1 tablet (25 mg total) by mouth daily.   metoprolol succinate 50 MG 24 hr tablet Commonly known as: TOPROL-XL Take 1 tablet by mouth daily.   Ozempic (1 MG/DOSE) 4 MG/3ML Sopn Generic drug: Semaglutide (1 MG/DOSE) Inject 1 mg as directed once a week.   Unifine Pentips 31G X 5 MM Misc Generic drug: Insulin Pen Needle Use as directed.         ALLERGIES: Allergies  Allergen Reactions   Jardiance [Empagliflozin] Other (See Comments)    Yeast infection   Metformin Diarrhea and Nausea Only   Oxycodone Itching   Oxycodone-Acetaminophen Itching     REVIEW OF SYSTEMS: A comprehensive ROS was conducted with the patient and is negative except as per HPI    OBJECTIVE:   VITAL SIGNS: BP 122/80 (BP Location: Left Arm, Patient Position: Sitting, Cuff Size: Large)   Pulse 78   Ht 5\' 7"  (1.702 m)   Wt 218 lb (98.9 kg)   SpO2 99%   BMI 34.14 kg/m    PHYSICAL EXAM:  General: Pt appears well and is in NAD  Neck: General: Supple without adenopathy or carotid bruits. Thyroid: Thyroid size normal.  No goiter or nodules appreciated.   Lungs: Clear with good BS bilat with no rales, rhonchi, or wheezes  Heart: RRR  Abdomen: Normoactive bowel sounds, soft, nontender, without masses or organomegaly palpable  Extremities:  Lower extremities - No pretibial edema. No lesions.  Neuro: MS is good with appropriate affect, pt is  alert and Ox3    DM foot exam: 12/03/2021  The skin of the feet is intact without sores or ulcerations. The pedal pulses are 2+ on right and 2+ on left. The sensation is intact to a screening 5.07, 10 gram monofilament bilaterally   DATA REVIEWED:  Lab Results  Component Value Date   HGBA1C 6.1 (A) 10/17/2022   HGBA1C 6.8 (H) 07/25/2022   HGBA1C 6.9 (A) 03/29/2022    Latest Reference Range & Units 07/25/22 08:20  Sodium 135 - 145 mEq/L 141  Potassium 3.5 - 5.1 mEq/L 4.5  Chloride 96 - 112 mEq/L 107  CO2 19 - 32 mEq/L 28  Glucose 70 - 99 mg/dL 940 (H)  BUN 6 - 23 mg/dL 24 (H)  Creatinine 7.68 - 1.20 mg/dL 0.88  Calcium 8.4 - 11.0 mg/dL 9.3  GFR >31.59 mL/min 53.88 (L)     Latest Reference Range & Units 12/03/21 09:10  Creatinine,U mg/dL 45.8  Microalb, Ur 0.0 - 1.9 mg/dL <5.9  MICROALB/CREAT RATIO 0.0 - 30.0 mg/g 0.9     ASSESSMENT / PLAN / RECOMMENDATIONS:   1) Type 2 Diabetes Mellitus, Optimally controlled, Without complications - Most recent A1c of 6.1 %. Goal A1c < 7.0 %.     -A1c remains optimal and decreasing, will reduce insulin as below - Intolerant to Jardiance due to recurrent yeast infections  - She is intolerant to Trulicity due to nausea.  Bydureon caused renal deterioration - She initially stopped  Ozempic because she was under the impression that it deteriorates her renal function . She has been back on it after reading the  literature - She will be off Ozempic for a month prior to oral surgery, she was provided with a sample pen of fiasp and a correction scale to use before each meal during that period  of time   MEDICATIONS:  -  Switch Levemir  to lantus 16 units daily  - Continue Ozempic 1 mg weekly  - Use Fiasp per correction scale for the month while off Ozempic ( BG -130/30)    EDUCATION / INSTRUCTIONS: BG monitoring instructions: Patient is instructed to check her blood sugars 3 times a day, before meals . Call [facility]  if: BG persistently < 70  I reviewed the Rule of 15 for the treatment of hypoglycemia in detail with the patient. Literature supplied.   2) Diabetic complications:  Eye: Does not have known diabetic retinopathy.  Neuro/ Feet: Does not have known diabetic peripheral neuropathy. Renal: Patient does not have known baseline CKD. She is  on an ACEI/ARB at present.       Follow-up in 6 months       Signed electronically by: Lyndle Herrlich, MD  [facility]  [facility] [address] [place] [place], [place] [address] Phone: [phone] FAX: [phone]   CC: Etta Grandchild, MD [address] [place] [address] Phone: [phone]  Fax: [phone]    Return to Endocrinology clinic as below: Future Appointments  Date Time Provider Department Center  11/12/2022  9:00 AM [facility] 2 [id number] [address] [facility]

## 2022-10-18 ENCOUNTER — Encounter: Payer: Self-pay | Admitting: Internal Medicine

## 2022-10-30 ENCOUNTER — Other Ambulatory Visit (HOSPITAL_COMMUNITY): Payer: Self-pay

## 2022-11-05 ENCOUNTER — Other Ambulatory Visit: Payer: Self-pay

## 2022-11-05 ENCOUNTER — Other Ambulatory Visit (HOSPITAL_COMMUNITY): Payer: Self-pay

## 2022-11-12 ENCOUNTER — Encounter: Payer: Self-pay | Admitting: Internal Medicine

## 2022-11-12 ENCOUNTER — Ambulatory Visit
Admission: RE | Admit: 2022-11-12 | Discharge: 2022-11-12 | Disposition: A | Discharge status: Home / Self Care | Payer: No Typology Code available for payment source | Source: Ambulatory Visit | Attending: Internal Medicine | Admitting: Internal Medicine

## 2022-11-12 ENCOUNTER — Other Ambulatory Visit (HOSPITAL_COMMUNITY): Payer: Self-pay

## 2022-11-12 ENCOUNTER — Other Ambulatory Visit: Payer: Self-pay

## 2022-11-12 DIAGNOSIS — E785 Hyperlipidemia, unspecified: Secondary | ICD-10-CM

## 2022-11-12 DIAGNOSIS — E119 Type 2 diabetes mellitus without complications: Secondary | ICD-10-CM

## 2022-11-13 ENCOUNTER — Encounter: Payer: Self-pay | Admitting: Internal Medicine

## 2022-11-13 ENCOUNTER — Other Ambulatory Visit: Payer: Self-pay

## 2022-11-13 ENCOUNTER — Other Ambulatory Visit (HOSPITAL_COMMUNITY): Payer: Self-pay

## 2022-11-13 LAB — HM DIABETES EYE EXAM

## 2022-11-14 ENCOUNTER — Other Ambulatory Visit (HOSPITAL_COMMUNITY): Payer: Self-pay

## 2022-12-09 ENCOUNTER — Telehealth: Payer: Self-pay | Admitting: *Deleted

## 2022-12-09 ENCOUNTER — Telehealth: Payer: Self-pay | Admitting: Internal Medicine

## 2022-12-09 NOTE — Telephone Encounter (Signed)
   Pre-operative Risk Assessment    Patient Name: Shelly Whitehead  DOB: 1966-05-19 MRN: [id number]     Request for Surgical Clearance    Procedure:  Dental Extraction - Amount of Teeth to be Pulled:  1 TOOTH EXTRACTION WITH POSSIBILITY OF 1 DENTAL IMPLANT SAME DAY IF ABLE.   Date of Surgery:  Clearance TBD                                 Surgeon:  DR. Mearl Latin Surgeon's Group or Practice Name:  [facility] Phone number:  [phone] Fax number:  [phone] ATTN: JENNIE   Type of Clearance Requested:   - Medical ; NO MEDICATIONS LISTED AS NEEDING TO BE HELD   Type of Anesthesia:   IV SEDATION   Additional requests/questions:    Jiles Prows   12/09/2022, 1:55 PM

## 2022-12-09 NOTE — Telephone Encounter (Signed)
   Primary Cardiologist: Donato Heinz, MD  Chart reviewed as part of pre-operative protocol coverage. Simple dental extractions (1-2 teeth and/or implants) are considered low risk procedures per guidelines and generally do not require any specific cardiac clearance. It is also generally accepted that for simple extractions and dental cleanings, there is no need to interrupt blood thinner therapy.   SBE prophylaxis is not required for the patient.  I will route this recommendation to the requesting party via Epic fax function and remove from pre-op pool.  Please call with questions.  Trudi Ida, NP 12/09/2022, 2:27 PM

## 2022-12-09 NOTE — Telephone Encounter (Signed)
A representative with Dr.Debra Sacco's office calls today in regards to a future procedure and requiring clarification before proceeding.   PT is having a tooth extracted through them as well as receiving a dental implant. PT is requesting sedation and they are wanting to confirm with PCP whether Dr.Jones recommends this be carried out with their office or the hospital. Their concern with this is the sedation and the fact that PT had been asked to get back up with their cardiologist for this year.  PT's procedure is scheduled for tomorrow. I did let them know I wasn't sure if Dr.Jones would be able to perform a peer to peer with Dr.Sacco in time for the PT's appointment.  CB: [phone]  PT was last seen by [place] back in September.

## 2022-12-10 NOTE — Telephone Encounter (Signed)
I have called and spoke with Judson Roch, rep at Dr. Frankey Poot office, she has been informed of PCP recommendations.

## 2022-12-15 NOTE — Progress Notes (Unsigned)
Cardiology Office Note:    Date:  12/16/2022   ID:  Shelly Whitehead, DOB 16-Feb-1966, MRN [id number]  PCP:  Janith Lima, MD  Cardiologist:  Donato Heinz, MD  Electrophysiologist:  None   Referring MD: Janith Lima, MD   Chief Complaint  Patient presents with   Follow-up   Chest Pain    History of Present Illness:    Shelly Whitehead is a 57 y.o. female with a hx of diabetes, hypertension, possible SVT who presents for follow-up.  She was referred by [name], NP for evaluation of palpitations, initially seen on 02/10/2020.  She previously was followed at [facility] for cardiology.  She was taking Toprol-XL for palpitations.  She had a nuclear stress test on 11/08/2016, which was normal.  TTE on 10/08/2016 showed normal LV systolic function, mild diastolic dysfunction, mild AI.  Holter monitor on 09/05/2017 showed no significant arrhythmias.  She had an another Holter on 10/22/2016 which also showed no arrhythmias..  She was empirically started on beta-blocker for presumed SVT.  She had been taking Toprol-XL 100 mg daily, but moved to [place] and tapered off her metoprolol over the course of 1 month.  States that once off the metoprolol she began having palpitations again.  Reports that she had no palpitations when on metoprolol 100 mg daily.  During palpitations, she reports she feels like her heart is pounding and will feel nauseous.  Last for 10 to 15 minutes.  No lightheadedness, syncope, dyspnea, or chest pain.  She saw her PCP and has been restarted on metoprolol 50 mg daily, with improvement in symptoms.  She reports that she drinks 2 cups of coffee per day.  She previously tried stopping caffeine and did not help with her palpitations.  Drinks 1-2 beers per week.  No smoking history.  Father died of MI in 23s.  She walks for 30 minutes 3-4 times per week, and denies any exertional symptoms.  Echocardiogram on 02/23/2020 showed normal biventricular function, no significant  valvular disease.  Zio patch x 14 days on 09/13/2020 showed no significant arrhythmias.  Since last clinic visit, she reports that she is doing okay.  Does report occasional chest discomfort, about 1-2 times per week.  No clear cause, can occur at rest or with exertion.  Describes discomfort as tightness on left side of chest.  She walks nearly every day for 15 minutes up to 2 hours.  Reports some dyspnea with walking up hills but denies chest pain with this.  Denies any lightheadedness, syncope, lower extremity edema, or palpitations.     Past Medical History:  Diagnosis Date   Adhesive capsulitis of right shoulder    ASCUS of cervix with negative high risk HPV    Diabetes mellitus without complication (HCC)    Migraine with aura and with status migrainosus, not intractable    Postmenopausal bleeding    SVT (supraventricular tachycardia)     Past Surgical History:  Procedure Laterality Date   [place]  2016    Current Medications: Current Meds  Medication Sig   atorvastatin (LIPITOR) 20 MG tablet Take 1 tablet (20 mg total) by mouth daily.   Continuous Blood Gluc Sensor (DEXCOM G7 SENSOR) MISC Apply 1 sensor every 10 days.   insulin glargine (LANTUS SOLOSTAR) 100 UNIT/ML Solostar Pen Inject 20 Units into the skin daily.   Insulin Pen Needle 31G X 5 MM MISC Use in the morning,  at noon, in the evening, and at bedtime.   losartan (COZAAR) 25 MG tablet Take 1 tablet (25 mg total) by mouth daily.   Semaglutide, 1 MG/DOSE, 4 MG/3ML SOPN Inject 1 mg as directed once a week.   [DISCONTINUED] metoprolol succinate (TOPROL-XL) 50 MG 24 hr tablet Take 1 tablet by mouth daily.     Allergies:   Jardiance [empagliflozin], Metformin, Oxycodone, and Oxycodone-acetaminophen   Social History   Socioeconomic History   Marital status: Single    Spouse name: Not on file   Number of children: Not on file   Years of education: Not on file    Highest education level: Not on file  Occupational History   Occupation: attorney  Tobacco Use   Smoking status: Never   Smokeless tobacco: Never  Vaping Use   Vaping Use: Never used  Substance and Sexual Activity   Alcohol use: Yes    Alcohol/week: 3.0 standard drinks of alcohol    Types: 3 Cans of beer per week   Drug use: Never   Sexual activity: Yes    Partners: Female    Comment: 1st intercourse 57yo  Other Topics Concern   Not on file  Social History Narrative   Tobacco use, amount per day now: N/A   Past tobacco use, amount per day:N/A   How many years did you use tobacco:N/A   Alcohol use (drinks per week):1   Diet:   Do you drink/eat things with caffeine: YES   Marital status:     SINGLE                             What year were you married?   Do you live in a house, apartment, assisted living, condo, trailer, etc.? HOUSE   Is it one or more stories? YES   How many persons live in your home? ME   Do you have pets in your home?( please list) 2 CATS   Current or past profession: ATTORNEY   Do you exercise?              NO                    Type and how often?   Do you have a living will? YES   Do you have a DNR form?                                   If not, do you want to discuss one? YES   Do you have signed POA/HPOA forms?    YES                     If so, please bring to you appointment   Social Determinants of Health   Financial Resource Strain: Not on file  Food Insecurity: Not on file  Transportation Needs: Not on file  Physical Activity: Not on file  Stress: Not on file  Social Connections: Not on file     Family History: The patient's family history includes Alzheimer's disease in her mother; Cancer in her father.  ROS:   Please see the history of present illness.     All other systems reviewed and are negative.  EKGs/Labs/Other Studies Reviewed:    The following studies were reviewed today:   EKG:  EKG is  ordered today.  The  ekg ordered  today demonstrates normal sinus rhythm, rate 59, no ST abnormalities  Recent Labs: 10/17/2022: BUN 17; Creatinine, Ser 1.06; Potassium 4.5; Sodium 140  Recent Lipid Panel    Component Value Date/Time   CHOL 145 12/03/2021 0910   TRIG 136.0 12/03/2021 0910   HDL 52.60 12/03/2021 0910   CHOLHDL 3 12/03/2021 0910   VLDL 27.2 12/03/2021 0910   LDLCALC 65 12/03/2021 0910   LDLCALC 62 04/30/2021 0956    Physical Exam:    VS:  BP 118/72 (BP Location: Right Arm, Patient Position: Sitting, Cuff Size: Large)   Pulse 66   Ht 5\' 7"  (1.702 m)   Wt 221 lb (100.2 kg)   BMI 34.61 kg/m     Wt Readings from Last 3 Encounters:  12/16/22 221 lb (100.2 kg)  10/17/22 218 lb (98.9 kg)  07/25/22 216 lb (98 kg)     TKZ:SWFU nourished, well developed in no acute distress HEENT: Normal NECK: No JVD; No carotid bruits LYMPHATICS: No lymphadenopathy CARDIAC: RRR, no murmurs, rubs, gallops RESPIRATORY:  Clear to auscultation without rales, wheezing or rhonchi  ABDOMEN: Soft, non-tender, non-distended MUSCULOSKELETAL:  No edema; No deformity  SKIN: Warm and dry NEUROLOGIC:  Alert and oriented x 3 PSYCHIATRIC:  Normal affect   ASSESSMENT:    1. Chest pain of uncertain etiology   2. Paroxysmal SVT (supraventricular tachycardia)   3. CAD in native artery   4. Hyperlipidemia, unspecified hyperlipidemia type     PLAN:    CAD: Calcium score 30 on 11/12/2022 (87th percentile).  On atorvastatin 20 mg daily.  LDL 65 on 12/03/2021 -She is reporting atypical chest pain.  Recommend coronary CTA to evaluate for obstructive CAD.  Palpitations: Negative Holter monitor x2 during work-up at [facility] in 2017/2019, but was started on Toprol-XL for empiric treatment of suspected SVT.  Symptoms improved with metoprolol.  Zio patch x 14 days on 09/13/2020 showed no significant arrhythmias. -Continue Toprol-XL 50 mg daily  Abnormal EKG: Inferior Q waves on prior EKG.  Echocardiogram on 02/23/2020 showed normal  biventricular function, no significant valvular disease.  Aortic regurgitation: Mild on TTE in 2017, echo on 02/23/2020 showed trivial AI  Hyperlipidemia: On atorvastatin 20 mg daily.  LDL 65 on 12/03/2021.  Check lipid panel  T2DM: On insulin.  A1c 6.1% on 10/17/2022  Hypertension: On losartan 25 mg daily and Toprol-XL 50 mg daily.  Appears controlled  RTC in 6 months  Medication Adjustments/Labs and Tests Ordered: Current medicines are reviewed at length with the patient today.  Concerns regarding medicines are outlined above.  Orders Placed This Encounter  Procedures   CT CORONARY MORPH W/CTA COR W/SCORE W/CA W/CM &/OR WO/CM   Basic metabolic panel   Lipid panel   EKG 12-Lead   Meds ordered this encounter  Medications   metoprolol succinate (TOPROL-XL) 50 MG 24 hr tablet    Sig: Take 1 tablet (50 mg total) by mouth daily.    Dispense:  90 tablet    Refill:  3    Patient Instructions  Medication Instructions:  Your physician recommends that you continue on your current medications as directed. Please refer to the Current Medication list given to you today.  *If you need a refill on your cardiac medications before your next appointment, please call your pharmacy*   Lab Work: BMET, Lipid today  If you have labs (blood work) drawn today and your tests are completely normal, you will receive your results only by: [place] (if you  have MyChart) OR A paper copy in the mail If you have any lab test that is abnormal or we need to change your treatment, we will call you to review the results.   Testing/Procedures: Coronary CTA-see instructions below  Follow-Up: At [facility], you and your health needs are our priority.  As part of our continuing mission to provide you with exceptional heart care, we have created designated Provider Care Teams.  These Care Teams include your primary Cardiologist (physician) and Advanced Practice Providers (APPs -  Physician  Assistants and Nurse Practitioners) who all work together to provide you with the care you need, when you need it.  We recommend signing up for the patient portal called "MyChart".  Sign up information is provided on this After Visit Summary.  MyChart is used to connect with patients for Virtual Visits (Telemedicine).  Patients are able to view lab/test results, encounter notes, upcoming appointments, etc.  Non-urgent messages can be sent to your provider as well.   To learn more about what you can do with MyChart, go to [email or web address].    Your next appointment:   6 month(s)  Provider:   Little Ishikawa, MD     Other Instructions   Your cardiac CT will be scheduled at one of the below locations:   [facility] [address] [place], [place] [address] [phone]   If scheduled at [facility], please arrive at the [facility] and Children's Entrance (Entrance C2) of [facility] 30 minutes prior to test start time. You can use the FREE valet parking offered at entrance C (encouraged to control the heart rate for the test)  Proceed to the [facility] Radiology Department (first floor) to check-in and test prep.  All radiology patients and guests should use entrance C2 at [facility], accessed from [facility], even though the hospital's physical address listed is [address]     Please follow these instructions carefully (unless otherwise directed):  On the Night Before the Test: Be sure to Drink plenty of water. Do not consume any caffeinated/decaffeinated beverages or chocolate 12 hours prior to your test. Do not take any antihistamines 12 hours prior to your test.  On the Day of the Test: Drink plenty of water until 1 hour prior to the test. Do not eat any food 1 hour prior to test. You may take your regular medications prior to the test.  FEMALES- please wear underwire-free bra if available, avoid  dresses & tight clothing      After the Test: Drink plenty of water. After receiving IV contrast, you may experience a mild flushed feeling. This is normal. On occasion, you may experience a mild rash up to 24 hours after the test. This is not dangerous. If this occurs, you can take Benadryl 25 mg and increase your fluid intake. If you experience trouble breathing, this can be serious. If it is severe call 911 IMMEDIATELY. If it is mild, please call our office. If you take any of these medications: Glipizide/Metformin, Avandament, Glucavance, please do not take 48 hours after completing test unless otherwise instructed.  We will call to schedule your test 2-4 weeks out understanding that some insurance companies will need an authorization prior to the service being performed.   For non-scheduling related questions, please contact the cardiac imaging nurse navigator should you have any questions/concerns: Rockwell [place], Cardiac Imaging Nurse Navigator Larey Brick, Cardiac Imaging Nurse Navigator [facility] Direct Office Dial: [phone]   For scheduling needs, including cancellations and rescheduling, please call [place], [phone].      Signed, Little Ishikawa, MD  12/16/2022 11:10 PM    [facility]

## 2022-12-16 ENCOUNTER — Other Ambulatory Visit (HOSPITAL_COMMUNITY): Payer: Self-pay

## 2022-12-16 ENCOUNTER — Ambulatory Visit: Payer: 59 | Attending: Cardiology | Admitting: Cardiology

## 2022-12-16 ENCOUNTER — Encounter: Payer: Self-pay | Admitting: Cardiology

## 2022-12-16 VITALS — BP 118/72 | HR 66 | Ht 67.0 in | Wt 221.0 lb

## 2022-12-16 DIAGNOSIS — I471 Supraventricular tachycardia, unspecified: Secondary | ICD-10-CM | POA: Diagnosis not present

## 2022-12-16 DIAGNOSIS — E785 Hyperlipidemia, unspecified: Secondary | ICD-10-CM | POA: Diagnosis not present

## 2022-12-16 DIAGNOSIS — R079 Chest pain, unspecified: Secondary | ICD-10-CM | POA: Diagnosis not present

## 2022-12-16 DIAGNOSIS — I2510 Atherosclerotic heart disease of native coronary artery without angina pectoris: Secondary | ICD-10-CM

## 2022-12-16 MED ORDER — METOPROLOL SUCCINATE ER 50 MG PO TB24
50.0000 mg | ORAL_TABLET | Freq: Every day | ORAL | 3 refills | Status: DC
  Filled 2022-12-16: qty 90, 90d supply, fill #0
  Filled 2023-03-16: qty 90, 90d supply, fill #1
  Filled 2023-06-14: qty 90, 90d supply, fill #2
  Filled 2023-09-12: qty 90, 90d supply, fill #3

## 2022-12-16 NOTE — Patient Instructions (Signed)
Medication Instructions:  Your physician recommends that you continue on your current medications as directed. Please refer to the Current Medication list given to you today.  *If you need a refill on your cardiac medications before your next appointment, please call your pharmacy*   Lab Work: BMET, Lipid today  If you have labs (blood work) drawn today and your tests are completely normal, you will receive your results only by: [place] (if you have MyChart) OR A paper copy in the mail If you have any lab test that is abnormal or we need to change your treatment, we will call you to review the results.   Testing/Procedures: Coronary CTA-see instructions below  Follow-Up: At [facility], you and your health needs are our priority.  As part of our continuing mission to provide you with exceptional heart care, we have created designated Provider Care Teams.  These Care Teams include your primary Cardiologist (physician) and Advanced Practice Providers (APPs -  Physician Assistants and Nurse Practitioners) who all work together to provide you with the care you need, when you need it.  We recommend signing up for the patient portal called "MyChart".  Sign up information is provided on this After Visit Summary.  MyChart is used to connect with patients for Virtual Visits (Telemedicine).  Patients are able to view lab/test results, encounter notes, upcoming appointments, etc.  Non-urgent messages can be sent to your provider as well.   To learn more about what you can do with MyChart, go to [email or web address].    Your next appointment:   6 month(s)  Provider:   Donato Heinz, MD     Other Instructions   Your cardiac CT will be scheduled at one of the below locations:   [facility] [address] [place], [place] [address] [phone]   If scheduled at [facility], please arrive at the [facility] and Children's Entrance (Entrance  C2) of [facility] 30 minutes prior to test start time. You can use the FREE valet parking offered at entrance C (encouraged to control the heart rate for the test)  Proceed to the [facility] Radiology Department (first floor) to check-in and test prep.  All radiology patients and guests should use entrance C2 at [facility], accessed from [facility], even though the hospital's physical address listed is [address]     Please follow these instructions carefully (unless otherwise directed):  On the Night Before the Test: Be sure to Drink plenty of water. Do not consume any caffeinated/decaffeinated beverages or chocolate 12 hours prior to your test. Do not take any antihistamines 12 hours prior to your test.  On the Day of the Test: Drink plenty of water until 1 hour prior to the test. Do not eat any food 1 hour prior to test. You may take your regular medications prior to the test.  FEMALES- please wear underwire-free bra if available, avoid dresses & tight clothing      After the Test: Drink plenty of water. After receiving IV contrast, you may experience a mild flushed feeling. This is normal. On occasion, you may experience a mild rash up to 24 hours after the test. This is not dangerous. If this occurs, you can take Benadryl 25 mg and increase your fluid intake. If you experience trouble breathing, this can be serious. If it is severe call 911 IMMEDIATELY. If it is mild, please call our office. If you take any of  these medications: Glipizide/Metformin, Avandament, Glucavance, please do not take 48 hours after completing test unless otherwise instructed.  We will call to schedule your test 2-4 weeks out understanding that some insurance companies will need an authorization prior to the service being performed.   For non-scheduling related questions, please contact the cardiac imaging nurse navigator should you have any  questions/concerns: Marchia Bond, Cardiac Imaging Nurse Navigator Gordy Clement, Cardiac Imaging Nurse Navigator [place] [facility] Direct Office Dial: [phone]   For scheduling needs, including cancellations and rescheduling, please call [place], [phone].

## 2022-12-17 LAB — BASIC METABOLIC PANEL
BUN/Creatinine Ratio: 18 (ref 9–23)
BUN: 19 mg/dL (ref 6–24)
CO2: 25 mmol/L (ref 20–29)
Calcium: 9.8 mg/dL (ref 8.7–10.2)
Chloride: 102 mmol/L (ref 96–106)
Creatinine, Ser: 1.08 mg/dL — ABNORMAL HIGH (ref 0.57–1.00)
Glucose: 114 mg/dL — ABNORMAL HIGH (ref 70–99)
Potassium: 4.7 mmol/L (ref 3.5–5.2)
Sodium: 140 mmol/L (ref 134–144)
eGFR: 60 mL/min/{1.73_m2} (ref >59)

## 2022-12-17 LAB — LIPID PANEL
Chol/HDL Ratio: 2.7 ratio (ref 0.0–4.4)
Cholesterol, Total: 133 mg/dL (ref 100–199)
HDL: 50 mg/dL (ref >39)
LDL Chol Calc (NIH): 61 mg/dL (ref 0–99)
Triglycerides: 124 mg/dL (ref 0–149)
VLDL Cholesterol Cal: 22 mg/dL (ref 5–40)

## 2022-12-21 ENCOUNTER — Encounter: Payer: Self-pay | Admitting: Internal Medicine

## 2022-12-25 ENCOUNTER — Telehealth (HOSPITAL_COMMUNITY): Payer: Self-pay | Admitting: Emergency Medicine

## 2022-12-25 ENCOUNTER — Telehealth (HOSPITAL_COMMUNITY): Payer: Self-pay | Admitting: *Deleted

## 2022-12-25 NOTE — Telephone Encounter (Signed)
Attempted to call patient regarding upcoming cardiac CT appointment. °Left message on voicemail with name and callback number °Wyland Rastetter RN Navigator Cardiac Imaging °[facility] °[phone] Office °[phone] Cell ° °

## 2022-12-25 NOTE — Telephone Encounter (Signed)
Patient returning call regarding her upcoming cardiac imaging study; pt verbalizes understanding of appt date/time, parking situation and where to check in, pre-test NPO status, and verified current allergies; name and call back number provided for further questions should they arise  Gordy Clement RN Navigator Cardiac [facility] [phone] office [phone] cell  Patient to take her daily medication. She is aware to arrive at 7:30am.

## 2022-12-27 ENCOUNTER — Ambulatory Visit (HOSPITAL_COMMUNITY)
Admission: RE | Admit: 2022-12-27 | Discharge: 2022-12-27 | Disposition: A | Discharge status: Home / Self Care | Payer: 59 | Source: Ambulatory Visit | Attending: Cardiology | Admitting: Cardiology

## 2022-12-27 DIAGNOSIS — R079 Chest pain, unspecified: Secondary | ICD-10-CM | POA: Insufficient documentation

## 2022-12-27 MED ORDER — IOHEXOL 350 MG/ML SOLN
95.0000 mL | Freq: Once | INTRAVENOUS | Status: AC | PRN
  Administered 2022-12-27: 95 mL via INTRAVENOUS

## 2022-12-27 MED ORDER — METOPROLOL TARTRATE 5 MG/5ML IV SOLN
INTRAVENOUS | Status: AC
  Administered 2022-12-27: 5 mg via INTRAVENOUS
  Filled 2022-12-27: qty 10

## 2022-12-27 MED ORDER — METOPROLOL TARTRATE 5 MG/5ML IV SOLN: 5.0000 mg | Freq: Once | INTRAVENOUS | Status: AC

## 2022-12-27 MED ORDER — NITROGLYCERIN 0.4 MG SL SUBL
SUBLINGUAL_TABLET | SUBLINGUAL | Status: AC
  Administered 2022-12-27: 0.8 mg via SUBLINGUAL
  Filled 2022-12-27: qty 2

## 2022-12-27 MED ORDER — NITROGLYCERIN 0.4 MG SL SUBL: 0.8000 mg | SUBLINGUAL_TABLET | Freq: Once | SUBLINGUAL | Status: AC

## 2023-01-04 ENCOUNTER — Other Ambulatory Visit: Payer: Self-pay | Admitting: Internal Medicine

## 2023-01-04 DIAGNOSIS — E785 Hyperlipidemia, unspecified: Secondary | ICD-10-CM

## 2023-01-04 MED ORDER — ATORVASTATIN CALCIUM 20 MG PO TABS
20.0000 mg | ORAL_TABLET | Freq: Every day | ORAL | 1 refills | Status: DC
  Filled 2023-01-04 – 2023-01-14 (×2): qty 90, 90d supply, fill #0
  Filled 2023-04-14: qty 90, 90d supply, fill #1

## 2023-01-06 ENCOUNTER — Other Ambulatory Visit (HOSPITAL_COMMUNITY): Payer: Self-pay

## 2023-01-06 ENCOUNTER — Other Ambulatory Visit: Payer: Self-pay

## 2023-01-14 ENCOUNTER — Other Ambulatory Visit (HOSPITAL_COMMUNITY): Payer: Self-pay

## 2023-02-06 ENCOUNTER — Other Ambulatory Visit (HOSPITAL_COMMUNITY): Payer: Self-pay

## 2023-02-06 ENCOUNTER — Other Ambulatory Visit: Payer: Self-pay

## 2023-02-07 ENCOUNTER — Other Ambulatory Visit (HOSPITAL_COMMUNITY): Payer: Self-pay

## 2023-02-07 ENCOUNTER — Other Ambulatory Visit: Payer: Self-pay

## 2023-02-28 ENCOUNTER — Other Ambulatory Visit (HOSPITAL_COMMUNITY): Payer: Self-pay

## 2023-02-28 ENCOUNTER — Other Ambulatory Visit: Payer: Self-pay

## 2023-02-28 ENCOUNTER — Other Ambulatory Visit: Payer: Self-pay | Admitting: Internal Medicine

## 2023-02-28 MED ORDER — OZEMPIC (1 MG/DOSE) 4 MG/3ML ~~LOC~~ SOPN
1.0000 mg | PEN_INJECTOR | SUBCUTANEOUS | 3 refills | Status: DC
  Filled 2023-02-28: qty 9, 84d supply, fill #0
  Filled 2023-03-04: qty 3, 28d supply, fill #0
  Filled 2023-03-30: qty 3, 28d supply, fill #1
  Filled 2023-04-28: qty 3, 28d supply, fill #2
  Filled 2023-05-28: qty 3, 28d supply, fill #3

## 2023-03-05 ENCOUNTER — Other Ambulatory Visit: Payer: Self-pay | Admitting: Internal Medicine

## 2023-03-05 ENCOUNTER — Other Ambulatory Visit (HOSPITAL_COMMUNITY): Payer: Self-pay

## 2023-03-05 ENCOUNTER — Other Ambulatory Visit: Payer: Self-pay | Admitting: Family Medicine

## 2023-03-05 DIAGNOSIS — Z1231 Encounter for screening mammogram for malignant neoplasm of breast: Secondary | ICD-10-CM

## 2023-03-16 ENCOUNTER — Other Ambulatory Visit: Payer: Self-pay | Admitting: Internal Medicine

## 2023-03-17 ENCOUNTER — Other Ambulatory Visit (HOSPITAL_COMMUNITY): Payer: Self-pay

## 2023-03-17 ENCOUNTER — Other Ambulatory Visit: Payer: Self-pay

## 2023-03-17 MED ORDER — DEXCOM G7 SENSOR MISC
1.0000 | 3 refills | Status: DC
  Filled 2023-03-17: qty 9, 90d supply, fill #0
  Filled 2023-06-14: qty 9, 90d supply, fill #1
  Filled 2023-09-12: qty 9, 90d supply, fill #2
  Filled 2023-12-21: qty 9, 90d supply, fill #3

## 2023-03-18 ENCOUNTER — Other Ambulatory Visit (HOSPITAL_COMMUNITY): Payer: Self-pay

## 2023-03-18 ENCOUNTER — Other Ambulatory Visit: Payer: Self-pay

## 2023-04-04 ENCOUNTER — Ambulatory Visit
Admission: RE | Admit: 2023-04-04 | Discharge: 2023-04-04 | Disposition: A | Discharge status: Home / Self Care | Payer: 59 | Source: Ambulatory Visit

## 2023-04-04 DIAGNOSIS — Z1231 Encounter for screening mammogram for malignant neoplasm of breast: Secondary | ICD-10-CM

## 2023-04-24 ENCOUNTER — Other Ambulatory Visit (HOSPITAL_COMMUNITY): Payer: Self-pay

## 2023-04-24 ENCOUNTER — Encounter: Payer: Self-pay | Admitting: Internal Medicine

## 2023-04-24 ENCOUNTER — Ambulatory Visit (INDEPENDENT_AMBULATORY_CARE_PROVIDER_SITE_OTHER): Payer: 59 | Admitting: Internal Medicine

## 2023-04-24 VITALS — BP 124/72 | HR 74 | Ht 67.0 in | Wt 223.0 lb

## 2023-04-24 DIAGNOSIS — Z794 Long term (current) use of insulin: Secondary | ICD-10-CM | POA: Diagnosis not present

## 2023-04-24 DIAGNOSIS — E113399 Type 2 diabetes mellitus with moderate nonproliferative diabetic retinopathy without macular edema, unspecified eye: Secondary | ICD-10-CM | POA: Diagnosis not present

## 2023-04-24 LAB — POCT GLYCOSYLATED HEMOGLOBIN (HGB A1C): Hemoglobin A1C: 6 % — AB (ref 4.0–5.6)

## 2023-04-24 MED ORDER — LANTUS SOLOSTAR 100 UNIT/ML ~~LOC~~ SOPN
12.0000 [IU] | PEN_INJECTOR | Freq: Every day | SUBCUTANEOUS | 3 refills | Status: DC
  Filled 2023-04-24 – 2023-04-28 (×3): qty 15, 125d supply, fill #0
  Filled 2023-05-20 – 2023-06-02 (×2): qty 9, 75d supply, fill #0
  Filled 2023-08-11: qty 9, 75d supply, fill #1
  Filled 2023-10-27: qty 9, 75d supply, fill #2

## 2023-04-24 MED ORDER — INSULIN PEN NEEDLE 31G X 5 MM MISC
1.0000 | Freq: Every day | 3 refills | Status: DC
  Filled 2023-04-24 – 2023-09-06 (×2): qty 100, 90d supply, fill #0
  Filled 2023-12-09: qty 100, 90d supply, fill #1

## 2023-04-24 NOTE — Patient Instructions (Addendum)
-  Decrease  Lantus 12  units daily  -Continue Ozempic 1 mg weekly    HOW TO TREAT LOW BLOOD SUGARS (Blood sugar LESS THAN 70 MG/DL) Please follow the RULE OF 15 for the treatment of hypoglycemia treatment (when your (blood sugars are less than 70 mg/dL)   STEP 1: Take 15 grams of carbohydrates when your blood sugar is low, which includes:  3-4 GLUCOSE TABS  OR 3-4 OZ OF JUICE OR REGULAR SODA OR ONE TUBE OF GLUCOSE GEL    STEP 2: RECHECK blood sugar in 15 MINUTES STEP 3: If your blood sugar is still low at the 15 minute recheck --> then, go back to STEP 1 and treat AGAIN with another 15 grams of carbohydrates.

## 2023-04-24 NOTE — Progress Notes (Signed)
Name: Shelly Whitehead  MRN/ DOB: [id number], Sep 13, 1966   Age/ Sex: 57 y.o., female    PCP: Etta Grandchild, MD   Reason for Endocrinology Evaluation: Type 2 Diabetes Mellitus     Date of Initial Endocrinology Visit: 12/03/2021    PATIENT IDENTIFIER: Shelly Whitehead is a 57 y.o. female with a past medical history of T2DM, dyslipidemia, Hx of PCOS and migraine headaches. The patient presented for initial endocrinology clinic visit on 12/03/2021 for consultative assistance with her diabetes management.    HPI: Ms. Benn was    Diagnosed with DM 2015 Prior Medications tried/Intolerance: Metformin- diarrhea . Bydureon - renal function was low ? But no side effects . Trulicity -nausea                 Hemoglobin A1c has ranged from 6.6% in 2019, peaking at 7.5% in 2021.     On her initial visit to our clinic she had an A1c of 7.8%, she was on Levemir and Jardiance but she was having monthly yeast infection requiring monthly Diflucan prescriptions.  We stopped Jardiance and started Ozempic  She stopped Ozempic by September 2023 because she was under the impression that this was hurting her kidneys   SUBJECTIVE:   During the last visit (10/17/2022): A1c 6.1 %      Today (04/24/23): Shelly Whitehead is here for follow-up on diabetes management.  She checks her blood sugars multiple times daily, through Dexcom . The patient has  had hypoglycemic episodes since the last clinic visit, which typically occur at night    Denies nausea, vomiting  Denies constipation or  diarrhea    HOME DIABETES REGIMEN: Basaglar 16 units daily  Ozempic 1 mg weekly        Statin: yes ACE-I/ARB: yes Prior Diabetic Education: yes    CONTINUOUS GLUCOSE MONITORING RECORD INTERPRETATION    Dates of Recording: 5/31-6/13/2024  Sensor description: dexcom  Results statistics:   CGM use % of time 86  Average and SD 119/28  Time in range  96   %  % Time Above 180 3  % Time above 250 <1  % Time  Below target 0      Glycemic patterns summary: BG's optimal at night , and during the day   Hyperglycemic episodes  postprandial  Hypoglycemic episodes occurred N/A  Overnight periods: stable      DIABETIC COMPLICATIONS: Microvascular complications:   Denies: CKD, retinopathy, neuropathy  Last eye exam: Completed 11/18/2022  Macrovascular complications:   Denies: CAD, PVD, CVA   PAST HISTORY: Past Medical History:  Past Medical History:  Diagnosis Date   Adhesive capsulitis of right shoulder    ASCUS of cervix with negative high risk HPV    Diabetes mellitus without complication (HCC)    Migraine with aura and with status migrainosus, not intractable    Postmenopausal bleeding    SVT (supraventricular tachycardia)    Past Surgical History:  Past Surgical History:  Procedure Laterality Date   APPENDECTOMY     GALLBLADDER SURGERY  1998   SHOULDER SURGERY  2016    Social History:  reports that she has never smoked. She has never used smokeless tobacco. She reports current alcohol use of about 3.0 standard drinks of alcohol per week. She reports that she does not use drugs. Family History:  Family History  Problem Relation Age of Onset   Alzheimer's disease Mother    Cancer Father        Bladder  Breast cancer Neg Hx      HOME MEDICATIONS: Allergies as of 04/24/2023       Reactions   Jardiance [empagliflozin] Other (See Comments)   Yeast infection   Metformin Diarrhea, Nausea Only   Oxycodone Itching   Oxycodone-acetaminophen Itching        Medication List        Accurate as of April 24, 2023  6:45 AM. If you have any questions, ask your nurse or doctor.          atorvastatin 20 MG tablet Commonly known as: LIPITOR Take 1 tablet (20 mg total) by mouth daily.   B-D UF III MINI PEN NEEDLES 31G X 5 MM Misc Generic drug: Insulin Pen Needle Use in the morning, at noon, in the evening, and at bedtime.   Dexcom G7 Sensor Misc Apply 1 sensor  every 10 days.   Lantus SoloStar 100 UNIT/ML Solostar Pen Generic drug: insulin glargine Inject 20 Units into the skin daily.   losartan 25 MG tablet Commonly known as: COZAAR Take 1 tablet (25 mg total) by mouth daily.   metoprolol succinate 50 MG 24 hr tablet Commonly known as: TOPROL-XL Take 1 tablet (50 mg total) by mouth daily.   Ozempic (1 MG/DOSE) 4 MG/3ML Sopn Generic drug: Semaglutide (1 MG/DOSE) Inject 1 mg as directed once a week.         ALLERGIES: Allergies  Allergen Reactions   Jardiance [Empagliflozin] Other (See Comments)    Yeast infection   Metformin Diarrhea and Nausea Only   Oxycodone Itching   Oxycodone-Acetaminophen Itching     REVIEW OF SYSTEMS: A comprehensive ROS was conducted with the patient and is negative except as per HPI    OBJECTIVE:   VITAL SIGNS: There were no vitals taken for this visit.   PHYSICAL EXAM:  General: Pt appears well and is in NAD  Neck: General: Supple without adenopathy or carotid bruits. Thyroid: Thyroid size normal.  No goiter or nodules appreciated.   Lungs: Clear with good BS bilat with no rales, rhonchi, or wheezes  Heart: RRR  Abdomen: Normoactive bowel sounds, soft, nontender, without masses or organomegaly palpable  Extremities:  Lower extremities - No pretibial edema. No lesions.  Neuro: MS is good with appropriate affect, pt is alert and Ox3    DM foot exam: 04/24/2023  The skin of the feet is without sores or ulcerations. The pedal pulses are 2+ on right and 2+ on left. The sensation is intact to a screening 5.07, 10 gram monofilament bilaterally   DATA REVIEWED:  Lab Results  Component Value Date   HGBA1C 6.1 (A) 10/17/2022   HGBA1C 6.8 (H) 07/25/2022   HGBA1C 6.9 (A) 03/29/2022    Latest Reference Range & Units 12/16/22 14:56  Sodium 134 - 144 mmol/L 140  Potassium 3.5 - 5.2 mmol/L 4.7  Chloride 96 - 106 mmol/L 102  CO2 20 - 29 mmol/L 25  Glucose 70 - 99 mg/dL 409 (H)  BUN 6 - 24  mg/dL 19  Creatinine 8.11 - 9.14 mg/dL 7.82 (H)  Calcium 8.7 - 10.2 mg/dL 9.8  BUN/Creatinine Ratio 9 - 23  18  eGFR >59 mL/min/1.73 60  Total CHOL/HDL Ratio 0.0 - 4.4 ratio 2.7  Cholesterol, Total 100 - 199 mg/dL 956  HDL Cholesterol >21 mg/dL 50  Triglycerides 0 - 308 mg/dL 657  VLDL Cholesterol Cal 5 - 40 mg/dL 22  LDL Chol Calc (NIH) 0 - 99 mg/dL 61  (H): Data is  abnormally high    ASSESSMENT / PLAN / RECOMMENDATIONS:   1) Type 2 Diabetes Mellitus, Optimally controlled, With diabetic retinopathy complications - Most recent A1c of 6.0 %. Goal A1c < 7.0 %.     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 Call [facility] if: BG persistently < 70  I reviewed the Rule of 15 for the treatment of hypoglycemia in detail with the patient. Literature supplied.   2) Diabetic complications:  Eye: Does not have known diabetic retinopathy.  Neuro/ Feet: Does not have known diabetic peripheral neuropathy. Renal: Patient does not have known baseline CKD. She is  on an ACEI/ARB at present.       Follow-up in 6 months       Signed electronically by: Lyndle Herrlich, MD  [facility] End[facility]  [facility] [address] [place] [place], [place] [address] Phone: [phone] FAX: [phone]   CC: Etta Grandchild, MD [address] [place] [address] Phone: [phone]  Fax: [phone]    Return to Endocrinology  clinic as below: Future Appointments  Date Time Provider Department Center  04/24/2023  7:30 AM Regie Bunner, Konrad Dolores, MD [facility] None  07/23/2023  8:20 AM Little Ishikawa, MD [facility] None

## 2023-04-28 ENCOUNTER — Other Ambulatory Visit: Payer: Self-pay | Admitting: Internal Medicine

## 2023-04-28 ENCOUNTER — Other Ambulatory Visit (HOSPITAL_COMMUNITY): Payer: Self-pay

## 2023-04-28 ENCOUNTER — Other Ambulatory Visit: Payer: Self-pay

## 2023-04-28 DIAGNOSIS — E119 Type 2 diabetes mellitus without complications: Secondary | ICD-10-CM

## 2023-04-29 ENCOUNTER — Other Ambulatory Visit: Payer: Self-pay | Admitting: Internal Medicine

## 2023-04-29 ENCOUNTER — Other Ambulatory Visit (HOSPITAL_COMMUNITY): Payer: Self-pay

## 2023-04-29 DIAGNOSIS — N182 Chronic kidney disease, stage 2 (mild): Secondary | ICD-10-CM

## 2023-04-29 DIAGNOSIS — E119 Type 2 diabetes mellitus without complications: Secondary | ICD-10-CM

## 2023-04-29 MED ORDER — LOSARTAN POTASSIUM 25 MG PO TABS
25.0000 mg | ORAL_TABLET | Freq: Every day | ORAL | 0 refills | Status: DC
  Filled 2023-04-29: qty 90, 90d supply, fill #0

## 2023-05-02 ENCOUNTER — Other Ambulatory Visit (HOSPITAL_COMMUNITY): Payer: Self-pay

## 2023-05-05 ENCOUNTER — Other Ambulatory Visit (HOSPITAL_COMMUNITY): Payer: Self-pay

## 2023-05-20 ENCOUNTER — Other Ambulatory Visit (HOSPITAL_COMMUNITY): Payer: Self-pay

## 2023-05-28 ENCOUNTER — Encounter: Payer: Self-pay | Admitting: Internal Medicine

## 2023-05-28 LAB — HM DIABETES EYE EXAM

## 2023-05-29 ENCOUNTER — Encounter: Payer: Self-pay | Admitting: Internal Medicine

## 2023-05-29 ENCOUNTER — Other Ambulatory Visit (HOSPITAL_COMMUNITY): Payer: Self-pay

## 2023-06-02 ENCOUNTER — Other Ambulatory Visit (HOSPITAL_COMMUNITY): Payer: Self-pay

## 2023-06-04 ENCOUNTER — Other Ambulatory Visit (HOSPITAL_COMMUNITY): Payer: Self-pay

## 2023-06-04 MED ORDER — SEMAGLUTIDE (2 MG/DOSE) 8 MG/3ML ~~LOC~~ SOPN
2.0000 mg | PEN_INJECTOR | SUBCUTANEOUS | 3 refills | Status: DC
  Filled 2023-06-04: qty 3, 28d supply, fill #0
  Filled 2023-06-29: qty 3, 28d supply, fill #1
  Filled 2023-07-28 – 2023-07-30 (×2): qty 3, 28d supply, fill #2
  Filled 2023-09-06: qty 3, 28d supply, fill #3
  Filled 2023-09-30: qty 3, 28d supply, fill #4
  Filled 2023-10-28: qty 3, 28d supply, fill #5
  Filled 2023-11-25: qty 3, 28d supply, fill #6
  Filled 2023-12-21: qty 3, 28d supply, fill #7

## 2023-06-06 ENCOUNTER — Other Ambulatory Visit (HOSPITAL_COMMUNITY): Payer: Self-pay

## 2023-06-14 ENCOUNTER — Other Ambulatory Visit (HOSPITAL_COMMUNITY): Payer: Self-pay

## 2023-06-17 ENCOUNTER — Other Ambulatory Visit (HOSPITAL_COMMUNITY): Payer: Self-pay

## 2023-07-03 ENCOUNTER — Other Ambulatory Visit (HOSPITAL_COMMUNITY): Payer: Self-pay

## 2023-07-11 ENCOUNTER — Other Ambulatory Visit: Payer: Self-pay | Admitting: Internal Medicine

## 2023-07-11 DIAGNOSIS — E119 Type 2 diabetes mellitus without complications: Secondary | ICD-10-CM

## 2023-07-11 DIAGNOSIS — E785 Hyperlipidemia, unspecified: Secondary | ICD-10-CM

## 2023-07-13 ENCOUNTER — Other Ambulatory Visit: Payer: Self-pay | Admitting: Internal Medicine

## 2023-07-13 DIAGNOSIS — E119 Type 2 diabetes mellitus without complications: Secondary | ICD-10-CM

## 2023-07-13 DIAGNOSIS — E785 Hyperlipidemia, unspecified: Secondary | ICD-10-CM

## 2023-07-15 ENCOUNTER — Other Ambulatory Visit: Payer: Self-pay | Admitting: Internal Medicine

## 2023-07-15 DIAGNOSIS — E785 Hyperlipidemia, unspecified: Secondary | ICD-10-CM

## 2023-07-15 DIAGNOSIS — E119 Type 2 diabetes mellitus without complications: Secondary | ICD-10-CM

## 2023-07-18 ENCOUNTER — Other Ambulatory Visit: Payer: Self-pay | Admitting: Internal Medicine

## 2023-07-18 DIAGNOSIS — E119 Type 2 diabetes mellitus without complications: Secondary | ICD-10-CM

## 2023-07-18 DIAGNOSIS — E785 Hyperlipidemia, unspecified: Secondary | ICD-10-CM

## 2023-07-19 ENCOUNTER — Encounter: Payer: Self-pay | Admitting: Internal Medicine

## 2023-07-23 ENCOUNTER — Ambulatory Visit: Payer: 59 | Attending: Cardiology | Admitting: Cardiology

## 2023-07-23 ENCOUNTER — Encounter: Payer: Self-pay | Admitting: Cardiology

## 2023-07-23 VITALS — BP 104/66 | HR 74 | Ht 67.0 in | Wt 220.0 lb

## 2023-07-23 DIAGNOSIS — R400 Somnolence: Secondary | ICD-10-CM

## 2023-07-23 DIAGNOSIS — R0683 Snoring: Secondary | ICD-10-CM | POA: Diagnosis not present

## 2023-07-23 DIAGNOSIS — I2510 Atherosclerotic heart disease of native coronary artery without angina pectoris: Secondary | ICD-10-CM | POA: Diagnosis not present

## 2023-07-23 DIAGNOSIS — I10 Essential (primary) hypertension: Secondary | ICD-10-CM

## 2023-07-23 DIAGNOSIS — R002 Palpitations: Secondary | ICD-10-CM

## 2023-07-23 NOTE — Progress Notes (Unsigned)
Cardiology Office Note:    Date:  07/24/2023   ID:  Shelly Whitehead, DOB [birth year], MRN [id number]  PCP:  Etta Grandchild, MD  Cardiologist:  Little Ishikawa, MD  Electrophysiologist:  None   Referring MD: Etta Grandchild, MD   Chief Complaint  Patient presents with   Palpitations    History of Present Illness:    Shelly Whitehead is a 57 y.o. female with a hx of diabetes, hypertension, possible SVT who presents for follow-up.  She was referred by Richarda Blade, NP for evaluation of palpitations, initially seen on 02/10/2020.  She previously was followed at [facility] for cardiology.  She was taking Toprol-XL for palpitations.  She had a nuclear stress test on 11/08/2016, which was normal.  TTE on 10/08/2016 showed normal LV systolic function, mild diastolic dysfunction, mild AI.  Holter monitor on 09/05/2017 showed no significant arrhythmias.  She had an another Holter on 10/22/2016 which also showed no arrhythmias..  She was empirically started on beta-blocker for presumed SVT.  She had been taking Toprol-XL 100 mg daily, but moved to [place] and tapered off her metoprolol over the course of 1 month.  States that once off the metoprolol she began having palpitations again.  Reports that she had no palpitations when on metoprolol 100 mg daily.  During palpitations, she reports she feels like her heart is pounding and will feel nauseous.  Last for 10 to 15 minutes.  No lightheadedness, syncope, dyspnea, or chest pain.  She saw her PCP and has been restarted on metoprolol 50 mg daily, with improvement in symptoms.  She reports that she drinks 2 cups of coffee per day.  She previously tried stopping caffeine and did not help with her palpitations.  Drinks 1-2 beers per week.  No smoking history.  Father died of MI in 55s.  She walks for 30 minutes 3-4 times per week, and denies any exertional symptoms.  Echocardiogram on 02/23/2020 showed normal biventricular function, no significant valvular  disease.  Zio patch x 14 days on 09/13/2020 showed no significant arrhythmias.  Coronary CTA on 12/27/2022 showed nonobstructive CAD (less than 25% stenosis in LAD and distal RCA), calcium score 42 (89th percentile).  Since last clinic visit, she reports she is doing well.  Reports no recent palpitations.  Denies any chest pain, dyspnea, lightheadedness, syncope, or lower extremity edema.  Walking 3-4 miles per day.  Denies any exertional sxs.    Wt Readings from Last 3 Encounters:  07/23/23 220 lb (99.8 kg)  04/24/23 223 lb (101.2 kg)  12/16/22 221 lb (100.2 kg)     Past Medical History:  Diagnosis Date   Adhesive capsulitis of right shoulder    ASCUS of cervix with negative high risk HPV    Diabetes mellitus without complication (HCC)    Migraine with aura and with status migrainosus, not intractable    Postmenopausal bleeding    SVT (supraventricular tachycardia)     Past Surgical History:  Procedure Laterality Date   APPENDECTOMY     GALLBLADDER SURGERY  1998   SHOULDER SURGERY  2016    Current Medications: Current Meds  Medication Sig   atorvastatin (LIPITOR) 20 MG tablet Take 1 tablet (20 mg total) by mouth daily.   Continuous Glucose Sensor (DEXCOM G7 SENSOR) MISC Apply 1 sensor every 10 days.   insulin glargine (LANTUS SOLOSTAR) 100 UNIT/ML Solostar Pen Inject 12 Units into the skin daily.   Insulin Pen Needle 31G X 5 MM MISC Use to  inject insulin once a day in the afternoon.   losartan (COZAAR) 25 MG tablet Take 1 tablet (25 mg total) by mouth daily.   metoprolol succinate (TOPROL-XL) 50 MG 24 hr tablet Take 1 tablet (50 mg total) by mouth daily.   Semaglutide, 2 MG/DOSE, 8 MG/3ML SOPN Inject 2 mg as directed once a week.     Allergies:   Jardiance [empagliflozin], Metformin, Oxycodone, and Oxycodone-acetaminophen   Social History   Socioeconomic History   Marital status: Single    Spouse name: Not on file   Number of children: Not on file   Years of education:  Not on file   Highest education level: Not on file  Occupational History   Occupation: attorney  Tobacco Use   Smoking status: Never   Smokeless tobacco: Never  Vaping Use   Vaping status: Never Used  Substance and Sexual Activity   Alcohol use: Yes    Alcohol/week: 3.0 standard drinks of alcohol    Types: 3 Cans of beer per week   Drug use: Never   Sexual activity: Yes    Partners: Female    Comment: 1st intercourse 57yo  Other Topics Concern   Not on file  Social History Narrative   Tobacco use, amount per day now: N/A   Past tobacco use, amount per day:N/A   How many years did you use tobacco:N/A   Alcohol use (drinks per week):1   Diet:   Do you drink/eat things with caffeine: YES   Marital status:     SINGLE                             What year were you married?   Do you live in a house, apartment, assisted living, condo, trailer, etc.? HOUSE   Is it one or more stories? YES   How many persons live in your home? ME   Do you have pets in your home?( please list) 2 CATS   Current or past profession: ATTORNEY   Do you exercise?              NO                    Type and how often?   Do you have a living will? YES   Do you have a DNR form?                                   If not, do you want to discuss one? YES   Do you have signed POA/HPOA forms?    YES                     If so, please bring to you appointment   Social Determinants of Health   Financial Resource Strain: Not on file  Food Insecurity: Not on file  Transportation Needs: Not on file  Physical Activity: Unknown (10/22/2018)   [facility], [facility]   Exercise Vital Sign    Days of Exercise per Week: 1 day    Minutes of Exercise per Session: Not on file  Stress: Not on file  Social Connections: Not on file     Family History: The patient's family history includes Alzheimer's disease in her mother; Cancer in her father. There is no history of  Breast  cancer.  ROS:   Please see the history of present illness.     All other systems reviewed and are negative.  EKGs/Labs/Other Studies Reviewed:    The following studies were reviewed today:   EKG:  07/23/2023: Normal sinus rhythm, rate 74, no ST abnormalities  Recent Labs: 12/16/2022: BUN 19; Creatinine, Ser 1.08; Potassium 4.7; Sodium 140  Recent Lipid Panel    Component Value Date/Time   CHOL 133 12/16/2022 1456   TRIG 124 12/16/2022 1456   HDL 50 12/16/2022 1456   CHOLHDL 2.7 12/16/2022 1456   CHOLHDL 3 12/03/2021 0910   VLDL 27.2 12/03/2021 0910   LDLCALC 61 12/16/2022 1456   LDLCALC 62 04/30/2021 0956    Physical Exam:    VS:  BP 104/66 (BP Location: Left Arm, Patient Position: Sitting, Cuff Size: Large)   Pulse 74   Ht 5\' 7"  (1.702 m)   Wt 220 lb (99.8 kg)   BMI 34.46 kg/m     Wt Readings from Last 3 Encounters:  07/23/23 220 lb (99.8 kg)  04/24/23 223 lb (101.2 kg)  12/16/22 221 lb (100.2 kg)     WUJ:WJXB nourished, well developed in no acute distress HEENT: Normal NECK: No JVD; No carotid bruits LYMPHATICS: No lymphadenopathy CARDIAC: RRR, no murmurs, rubs, gallops RESPIRATORY:  Clear to auscultation without rales, wheezing or rhonchi  ABDOMEN: Soft, non-tender, non-distended MUSCULOSKELETAL:  No edema; No deformity  SKIN: Warm and dry NEUROLOGIC:  Alert and oriented x 3 PSYCHIATRIC:  Normal affect   ASSESSMENT:    1. Coronary artery disease involving native coronary artery of native heart without angina pectoris   2. Palpitations   3. Daytime somnolence   4. Snoring   5. Essential hypertension      PLAN:    CAD: Calcium score 30 on 11/12/2022 (87th percentile).  On atorvastatin 20 mg daily.  LDL 65 on 12/03/2021.  Reported atypical chest pain, coronary CTA on 12/27/2022 showed nonobstructive CAD (less than 25% stenosis in LAD and distal RCA), calcium score 42 (89th percentile). -Continue atorvastatin 20 mg daily  Palpitations: Negative  Holter monitor x2 during work-up at [facility] in 2017/2019, but was started on Toprol-XL for empiric treatment of suspected SVT.  Symptoms improved with metoprolol.  Zio patch x 14 days on 09/13/2020 showed no significant arrhythmias. -Continue Toprol-XL 50 mg daily  Abnormal EKG: Inferior Q waves on prior EKG.  Echocardiogram on 02/23/2020 showed normal biventricular function, no significant valvular disease.  Aortic regurgitation: Mild on TTE in 2017, echo on 02/23/2020 showed trivial AI  Hyperlipidemia: On atorvastatin 20 mg daily.  LDL 61 to 524  T2DM: On insulin.  A1c 6.0% on 04/24/2023  Hypertension: On losartan 25 mg daily and Toprol-XL 50 mg daily.  Appears controlled  Snoring/daytime somnolence: Check sleep study.  STOP-BANG 4  RTC in 6 months  Medication Adjustments/Labs and Tests Ordered: Current medicines are reviewed at length with the patient today.  Concerns regarding medicines are outlined above.  Orders Placed This Encounter  Procedures   EKG 12-Lead   EKG 12-Lead   [facility] Sleep Study   No orders of the defined types were placed in this encounter.   Patient Instructions  Medication Instructions:  The current medical regimen is effective;  continue present plan and medications as directed. Please refer to the Current Medication list given to you today.  *If you need a refill on your cardiac medications before your next appointment, please call your pharmacy*  Lab Work: NONE If you  have labs (blood work) drawn today and your tests are completely normal, you will receive your results only by:  MyChart Message (if you have MyChart) OR  A paper copy in the mail If you have any lab test that is abnormal or we need to change your treatment, we will call you to review the results.  Testing/Procedures: home sleep study test INSTRUCTIONS ATTACHED  Follow-Up: At [facility], you and your health needs are our priority.  As part of our continuing mission to provide you  with exceptional heart care, we have created designated Provider Care Teams.  These Care Teams include your primary Cardiologist (physician) and Advanced Practice Providers (APPs -  Physician Assistants and Nurse Practitioners) who all work together to provide you with the care you need, when you need it.  Your next appointment:   12 month(s)  Provider:   Little Ishikawa, MD     Other Instructions WatchPAT?  Is a FDA cleared portable home sleep study test that uses a watch and 3 points of contact to monitor 7 different channels, including your heart rate, oxygen saturations, body position, snoring, and chest motion.  The study is easy to use from the comfort of your own home and accurately detect sleep apnea.  Before bed, you attach the chest sensor, attached the sleep apnea bracelet to your nondominant hand, and attach the finger probe.  After the study, the raw data is downloaded from the watch and scored for apnea events.   For more information: [email or web address]  Patient Testing Instructions:  Do not put battery into the device until bedtime when you are ready to begin the test. Please call the support number if you need assistance after following the instructions below: 24 hour support line- [phone] or [facility] support at [phone] (option 2)  Download the IntelWatchPAT One" app through the google play store or App Store  Be sure to turn on or enable access to bluetooth in settlings on your smartphone/ device  Make sure no other bluetooth devices are on and within the vicinity of your smartphone/ device and WatchPAT watch during testing.  Make sure to leave your smart phone/ device plugged in and charging all night.  When ready for bed:  Follow the instructions step by step in the WatchPAT One App to activate the testing device. For additional instructions, including video instruction, visit the WatchPAT One video on Youtube. You can search for  WatchPat One within Youtube (video is 4 minutes and 18 seconds) or enter: https://youtube/watch?v=BCce_vbiwxE Please note: You will be prompted to enter a Pin to connect via bluetooth when starting the test. The PIN will be assigned to you when you receive the test.  The device is disposable, but it recommended that you retain the device until you receive a call letting you know the study has been received and the results have been interpreted.  We will let you know if the study did not transmit to [place] properly after the test is completed. You do not need to call us to confirm the receipt of the test.  Please complete the test within 48 hours of receiving PIN.   Frequently Asked Questions:  What is Watch Dennie Bible one?  A single use fully disposable home sleep apnea testing device and will not need to be returned after completion.  What are the requirements to use WatchPAT one?  The be able to have a successful watchpat one sleep study, you should have your Watch pat one device,  your smart phone, watch pat one app, your PIN number and Internet access What type of phone do I need?  You should have a smart phone that uses Android 5.1 and above or any Iphone with IOS 10 and above How can I download the WatchPAT one app?  Based on your device type search for WatchPAT one app either in google play for android devices or APP store for Iphone's Where will I get my PIN for the study?  Your PIN will be provided by your physician's office. It is used for authentication and if you lose/forget your PIN, please reach out to your providers office.  I do not have Internet at home. Can I do WatchPAT one study?  WatchPAT One needs Internet connection throughout the night to be able to transmit the sleep data. You can use your home/local internet or your cellular's data package. However, it is always recommended to use home/local Internet. It is estimated that between 20MB-30MB will be used with each study.However, the  application will be looking for space in the phone to start the study.  What happens if I lose internet or bluetooth connection?  During the internet disconnection, your phone will not be able to transmit the sleep data. All the data, will be stored in your phone. As soon as the internet connection is back on, the phone will being sending the sleep data. During the bluetooth disconnection, WatchPAT one will not be able to to send the sleep data to your phone. Data will be kept in the [facility] one until two devices have bluetooth connection back on. As soon as the connection is back on, WatchPAT one will send the sleep data to the phone.  How long do I need to wear the WatchPAT one?  After you start the study, you should wear the device at least 6 hours.  How far should I keep my phone from the device?  During the night, your phone should be within 15 feet.  What happens if I leave the room for restroom or other reasons?  Leaving the room for any reason will not cause any problem. As soon as your get back to the room, both devices will reconnect and will continue to send the sleep data. Can I use my phone during the sleep study?  Yes, you can use your phone as usual during the study. But it is recommended to put your watchpat one on when you are ready to go to bed.  How will I get my study results?  A soon as you completed your study, your sleep data will be sent to the provider. They will then share the results with you when they are ready.      Signed, Little Ishikawa, MD  07/24/2023 11:00 AM    [facility]

## 2023-07-23 NOTE — Patient Instructions (Signed)
Medication Instructions:  The current medical regimen is effective;  continue present plan and medications as directed. Please refer to the Current Medication list given to you today.  *If you need a refill on your cardiac medications before your next appointment, please call your pharmacy*  Lab Work: NONE If you have labs (blood work) drawn today and your tests are completely normal, you will receive your results only by:  MyChart Message (if you have MyChart) OR  A paper copy in the mail If you have any lab test that is abnormal or we need to change your treatment, we will call you to review the results.  Testing/Procedures: home sleep study test INSTRUCTIONS ATTACHED  Follow-Up: At [facility], you and your health needs are our priority.  As part of our continuing mission to provide you with exceptional heart care, we have created designated Provider Care Teams.  These Care Teams include your primary Cardiologist (physician) and Advanced Practice Providers (APPs -  Physician Assistants and Nurse Practitioners) who all work together to provide you with the care you need, when you need it.  Your next appointment:   12 month(s)  Provider:   Little Ishikawa, MD     Other Instructions WatchPAT?  Is a FDA cleared portable home sleep study test that uses a watch and 3 points of contact to monitor 7 different channels, including your heart rate, oxygen saturations, body position, snoring, and chest motion.  The study is easy to use from the comfort of your own home and accurately detect sleep apnea.  Before bed, you attach the chest sensor, attached the sleep apnea bracelet to your nondominant hand, and attach the finger probe.  After the study, the raw data is downloaded from the watch and scored for apnea events.   For more information: [email or web address]  Patient Testing Instructions:  Do not put battery into the device until bedtime when you are ready  to begin the test. Please call the support number if you need assistance after following the instructions below: 24 hour support line- [phone] or [facility]AMAR support at [phone] (option 2)  Download the IntelWatchPAT One" app through the google play store or App Store  Be sure to turn on or enable access to bluetooth in settlings on your smartphone/ device  Make sure no other bluetooth devices are on and within the vicinity of your smartphone/ device and WatchPAT watch during testing.  Make sure to leave your smart phone/ device plugged in and charging all night.  When ready for bed:  Follow the instructions step by step in the WatchPAT One App to activate the testing device. For additional instructions, including video instruction, visit the WatchPAT One video on Youtube. You can search for WatchPat One within Youtube (video is 4 minutes and 18 seconds) or enter: [email or web address] Please note: You will be prompted to enter a Pin to connect via bluetooth when starting the test. The PIN will be assigned to you when you receive the test.  The device is disposable, but it recommended that you retain the device until you receive a call letting you know the study has been received and the results have been interpreted.  We will let you know if the study did not transmit to [place] properly after the test is completed. You do not need to call us to confirm the receipt of the test.  Please complete the test within 48 hours of receiving PIN.   Frequently Asked Questions:  What is Watch  Dennie Bible one?  A single use fully disposable home sleep apnea testing device and will not need to be returned after completion.  What are the requirements to use WatchPAT one?  The be able to have a successful watchpat one sleep study, you should have your Watch pat one device, your smart phone, watch pat one app, your PIN number and Internet access What type of phone do I need?  You should have a smart phone  that uses Android 5.1 and above or any Iphone with IOS 10 and above How can I download the WatchPAT one app?  Based on your device type search for WatchPAT one app either in google play for android devices or APP store for Iphone's Where will I get my PIN for the study?  Your PIN will be provided by your physician's office. It is used for authentication and if you lose/forget your PIN, please reach out to your providers office.  I do not have Internet at home. Can I do WatchPAT one study?  WatchPAT One needs Internet connection throughout the night to be able to transmit the sleep data. You can use your home/local internet or your cellular's data package. However, it is always recommended to use home/local Internet. It is estimated that between 20MB-30MB will be used with each study.However, the application will be looking for space in the phone to start the study.  What happens if I lose internet or bluetooth connection?  During the internet disconnection, your phone will not be able to transmit the sleep data. All the data, will be stored in your phone. As soon as the internet connection is back on, the phone will being sending the sleep data. During the bluetooth disconnection, WatchPAT one will not be able to to send the sleep data to your phone. Data will be kept in the [facility] one until two devices have bluetooth connection back on. As soon as the connection is back on, WatchPAT one will send the sleep data to the phone.  How long do I need to wear the WatchPAT one?  After you start the study, you should wear the device at least 6 hours.  How far should I keep my phone from the device?  During the night, your phone should be within 15 feet.  What happens if I leave the room for restroom or other reasons?  Leaving the room for any reason will not cause any problem. As soon as your get back to the room, both devices will reconnect and will continue to send the sleep data. Can I use my phone  during the sleep study?  Yes, you can use your phone as usual during the study. But it is recommended to put your watchpat one on when you are ready to go to bed.  How will I get my study results?  A soon as you completed your study, your sleep data will be sent to the provider. They will then share the results with you when they are ready.

## 2023-07-24 NOTE — Progress Notes (Signed)
Patient agreement reviewed and signed on 07/23/2023.  WatchPAT issued to patient on 07/23/2023 by Brunetta Genera, CMA. Patient aware to not open the WatchPAT box until contacted with the activation PIN. Patient profile initialized in CloudPAT on 07/23/2023 by Brunetta Genera, CMA. Device serial number: [id number]

## 2023-07-30 ENCOUNTER — Encounter: Payer: Self-pay | Admitting: Internal Medicine

## 2023-07-30 ENCOUNTER — Other Ambulatory Visit: Payer: Self-pay | Admitting: Internal Medicine

## 2023-07-30 ENCOUNTER — Other Ambulatory Visit (HOSPITAL_COMMUNITY): Payer: Self-pay

## 2023-07-30 ENCOUNTER — Ambulatory Visit (INDEPENDENT_AMBULATORY_CARE_PROVIDER_SITE_OTHER): Payer: 59 | Admitting: Internal Medicine

## 2023-07-30 VITALS — BP 116/78 | HR 82 | Temp 98.3°F | Ht 67.0 in | Wt 216.0 lb

## 2023-07-30 DIAGNOSIS — H608X3 Other otitis externa, bilateral: Secondary | ICD-10-CM | POA: Diagnosis not present

## 2023-07-30 DIAGNOSIS — Z0001 Encounter for general adult medical examination with abnormal findings: Secondary | ICD-10-CM | POA: Diagnosis not present

## 2023-07-30 DIAGNOSIS — E119 Type 2 diabetes mellitus without complications: Secondary | ICD-10-CM | POA: Diagnosis not present

## 2023-07-30 DIAGNOSIS — N182 Chronic kidney disease, stage 2 (mild): Secondary | ICD-10-CM

## 2023-07-30 DIAGNOSIS — E785 Hyperlipidemia, unspecified: Secondary | ICD-10-CM

## 2023-07-30 DIAGNOSIS — Z794 Long term (current) use of insulin: Secondary | ICD-10-CM | POA: Diagnosis not present

## 2023-07-30 LAB — CBC WITH DIFFERENTIAL/PLATELET
Basophils Absolute: 0 10*3/uL (ref 0.0–0.1)
Basophils Relative: 0.8 % (ref 0.0–3.0)
Eosinophils Absolute: 0.1 10*3/uL (ref 0.0–0.7)
Eosinophils Relative: 1.2 % (ref 0.0–5.0)
HCT: 44.3 % (ref 36.0–46.0)
Hemoglobin: 14.8 g/dL (ref 12.0–15.0)
Lymphocytes Relative: 29.9 % (ref 12.0–46.0)
Lymphs Abs: 1.3 10*3/uL (ref 0.7–4.0)
MCHC: 33.5 g/dL (ref 30.0–36.0)
MCV: 92 fl (ref 78.0–100.0)
Monocytes Absolute: 0.3 10*3/uL (ref 0.1–1.0)
Monocytes Relative: 7.6 % (ref 3.0–12.0)
Neutro Abs: 2.7 10*3/uL (ref 1.4–7.7)
Neutrophils Relative %: 60.5 % (ref 43.0–77.0)
Platelets: 266 10*3/uL (ref 150.0–400.0)
RBC: 4.81 Mil/uL (ref 3.87–5.11)
RDW: 12.9 % (ref 11.5–15.5)
WBC: 4.4 10*3/uL (ref 4.0–10.5)

## 2023-07-30 LAB — HEPATIC FUNCTION PANEL
ALT: 27 U/L (ref 0–35)
AST: 25 U/L (ref 0–37)
Albumin: 4.2 g/dL (ref 3.5–5.2)
Alkaline Phosphatase: 92 U/L (ref 39–117)
Bilirubin, Direct: 0.1 mg/dL (ref 0.0–0.3)
Total Bilirubin: 0.5 mg/dL (ref 0.2–1.2)
Total Protein: 7.6 g/dL (ref 6.0–8.3)

## 2023-07-30 LAB — URINALYSIS, ROUTINE W REFLEX MICROSCOPIC
Hgb urine dipstick: NEGATIVE
Ketones, ur: NEGATIVE
Leukocytes,Ua: NEGATIVE
Nitrite: NEGATIVE
RBC / HPF: NONE SEEN (ref 0–?)
Specific Gravity, Urine: >=1.03 — AB (ref 1.000–1.030)
Total Protein, Urine: NEGATIVE
Urine Glucose: NEGATIVE
Urobilinogen, UA: 0.2 (ref 0.0–1.0)
pH: 6 (ref 5.0–8.0)

## 2023-07-30 LAB — BASIC METABOLIC PANEL WITH GFR
BUN: 16 mg/dL (ref 6–23)
CO2: 28 meq/L (ref 19–32)
Calcium: 9.3 mg/dL (ref 8.4–10.5)
Chloride: 103 meq/L (ref 96–112)
Creatinine, Ser: 1.24 mg/dL — ABNORMAL HIGH (ref 0.40–1.20)
GFR: 48.36 mL/min — ABNORMAL LOW (ref >60.00)
Glucose, Bld: 130 mg/dL — ABNORMAL HIGH (ref 70–99)
Potassium: 4.4 meq/L (ref 3.5–5.1)
Sodium: 137 meq/L (ref 135–145)

## 2023-07-30 LAB — TSH: TSH: 1.85 u[IU]/mL (ref 0.35–5.50)

## 2023-07-30 LAB — MICROALBUMIN / CREATININE URINE RATIO
Creatinine,U: 316.5 mg/dL
Microalb Creat Ratio: 0.2 mg/g (ref 0.0–30.0)
Microalb, Ur: <0.7 mg/dL (ref 0.0–1.9)

## 2023-07-30 MED ORDER — LOSARTAN POTASSIUM 25 MG PO TABS
25.0000 mg | ORAL_TABLET | Freq: Every day | ORAL | 1 refills | Status: DC
  Filled 2023-07-30: qty 90, 90d supply, fill #0
  Filled 2023-10-27: qty 90, 90d supply, fill #1

## 2023-07-30 MED ORDER — NEOMYCIN-POLYMYXIN-HC 3.5-10000-1 OT SOLN
3.0000 [drp] | Freq: Three times a day (TID) | OTIC | 1 refills | Status: DC
  Filled 2023-07-30: qty 10, 7d supply, fill #0

## 2023-07-30 MED ORDER — ATORVASTATIN CALCIUM 20 MG PO TABS
20.0000 mg | ORAL_TABLET | Freq: Every day | ORAL | 1 refills | Status: DC
  Filled 2023-07-30: qty 90, 90d supply, fill #0
  Filled 2023-10-27: qty 90, 90d supply, fill #1

## 2023-07-30 NOTE — Patient Instructions (Signed)

## 2023-07-30 NOTE — Progress Notes (Signed)
Subjective:  Patient ID: Shelly Whitehead, female    DOB: 1966-09-07  Age: 57 y.o. MRN: [id number]  CC: Annual Exam, Diabetes, and Hyperlipidemia   HPI [name] presents for a CPX and f/up ---  She is active and denies DOE, CP, SOB, edema.  Outpatient Medications Prior to Visit  Medication Sig Dispense Refill   Continuous Glucose Sensor (DEXCOM G7 SENSOR) MISC Apply 1 sensor every 10 days. 9 each 3   insulin glargine (LANTUS SOLOSTAR) 100 UNIT/ML Solostar Pen Inject 12 Units into the skin daily. 15 mL 3   Insulin Pen Needle 31G X 5 MM MISC Use to inject insulin once a day in the afternoon. 100 each 3   metoprolol succinate (TOPROL-XL) 50 MG 24 hr tablet Take 1 tablet (50 mg total) by mouth daily. 90 tablet 3   Semaglutide, 2 MG/DOSE, 8 MG/3ML SOPN Inject 2 mg as directed once a week. 9 mL 3   atorvastatin (LIPITOR) 20 MG tablet Take 1 tablet (20 mg total) by mouth daily. 90 tablet 1   losartan (COZAAR) 25 MG tablet Take 1 tablet (25 mg total) by mouth daily. 90 tablet 0   No facility-administered medications prior to visit.    ROS Review of Systems  Constitutional: Negative.  Negative for diaphoresis, fatigue and unexpected weight change.  HENT:  Positive for ear discharge. Negative for ear pain, hearing loss and trouble swallowing.        Chronic itching and irritated feeling in both ears  Eyes: Negative.   Respiratory:  Negative for cough, chest tightness, shortness of breath and wheezing.   Cardiovascular:  Negative for chest pain, palpitations and leg swelling.  Gastrointestinal:  Negative for abdominal pain, constipation, diarrhea, nausea and vomiting.  Genitourinary: Negative.  Negative for difficulty urinating.  Musculoskeletal: Negative.  Negative for arthralgias, back pain, myalgias and neck pain.  Skin: Negative.   Neurological: Negative.  Negative for dizziness and weakness.  Hematological:  Negative for adenopathy. Does not bruise/bleed easily.   Psychiatric/Behavioral: Negative.      Objective:  BP 116/78 (BP Location: Right Arm, Patient Position: Sitting, Cuff Size: Large)   Pulse 82   Temp 98.3 F (36.8 C) (Oral)   Ht 5\' 7"  (1.702 m)   Wt 216 lb (98 kg)   SpO2 95%   BMI 33.83 kg/m   BP Readings from Last 3 Encounters:  07/30/23 116/78  07/23/23 104/66  04/24/23 124/72    Wt Readings from Last 3 Encounters:  07/30/23 216 lb (98 kg)  07/23/23 220 lb (99.8 kg)  04/24/23 223 lb (101.2 kg)    Physical Exam Vitals reviewed.  Constitutional:      Appearance: Normal appearance.  HENT:     Right Ear: Hearing, tympanic membrane, ear canal and external ear normal.     Left Ear: Hearing, tympanic membrane, ear canal and external ear normal.     Ears:     Comments: There is mild flaking in bilateral external auditory canals.    Nose: Nose normal.  Eyes:     General: No scleral icterus.    Conjunctiva/sclera: Conjunctivae normal.  Cardiovascular:     Rate and Rhythm: Normal rate and regular rhythm.     Heart sounds: No murmur heard. Pulmonary:     Effort: Pulmonary effort is normal.     Breath sounds: No stridor. No wheezing, rhonchi or rales.  Abdominal:     General: Abdomen is flat.     Palpations: There is no mass.  Tenderness: There is no abdominal tenderness. There is no guarding.     Hernia: No hernia is present.  Musculoskeletal:        General: Normal range of motion.     Cervical back: Neck supple.     Right lower leg: No edema.     Left lower leg: No edema.  Lymphadenopathy:     Cervical: No cervical adenopathy.  Skin:    General: Skin is warm and dry.     Findings: No rash.  Neurological:     General: No focal deficit present.     Mental Status: She is alert. Mental status is at baseline.  Psychiatric:        Mood and Affect: Mood normal.        Behavior: Behavior normal.     Lab Results  Component Value Date   WBC 4.4 07/30/2023   HGB 14.8 07/30/2023   HCT 44.3 07/30/2023   PLT  266.0 07/30/2023   GLUCOSE 130 (H) 07/30/2023   CHOL 133 12/16/2022   TRIG 124 12/16/2022   HDL 50 12/16/2022   LDLCALC 61 12/16/2022   ALT 27 07/30/2023   AST 25 07/30/2023   NA 137 07/30/2023   K 4.4 07/30/2023   CL 103 07/30/2023   CREATININE 1.24 (H) 07/30/2023   BUN 16 07/30/2023   CO2 28 07/30/2023   TSH 1.85 07/30/2023   HGBA1C 6.0 (A) 04/24/2023   MICROALBUR <0.7 07/30/2023    MM 3D SCREENING MAMMOGRAM BILATERAL BREAST  Result Date: 04/08/2023 CLINICAL DATA:  Screening. EXAM: DIGITAL SCREENING BILATERAL MAMMOGRAM WITH TOMOSYNTHESIS AND CAD TECHNIQUE: Bilateral screening digital craniocaudal and mediolateral oblique mammograms were obtained. Bilateral screening digital breast tomosynthesis was performed. The images were evaluated with computer-aided detection. COMPARISON:  Previous exam(s). ACR Breast Density Category b: There are scattered areas of fibroglandular density. FINDINGS: There are no findings suspicious for malignancy. IMPRESSION: No mammographic evidence of malignancy. A result letter of this screening mammogram will be mailed directly to the patient. RECOMMENDATION: Screening mammogram in one year. (Code:SM-B-01Y) BI-RADS CATEGORY  1: Negative. Electronically Signed   By: Frederico Hamman M.D.   On: 04/08/2023 12:23    Assessment & Plan:   Type 2 diabetes mellitus without complication, with long-term current use of insulin (HCC)- Her blood sugar is well-controlled. -     Basic metabolic panel; Future -     Urinalysis, Routine w reflex microscopic; Future -     Microalbumin / creatinine urine ratio; Future  Dyslipidemia, goal LDL below 70 - LDL goal achieved. Doing well on the statin  -     TSH; Future -     Hepatic function panel; Future  Chronic renal disease, stage 2, mildly decreased glomerular filtration rate (GFR) between 60-89 mL/min/1.73 square meter- Her renal function has declined slightly.  Will continue the ARB. -     Basic metabolic panel;  Future -     CBC with Differential/Platelet; Future -     Urinalysis, Routine w reflex microscopic; Future -     Microalbumin / creatinine urine ratio; Future -     Losartan Potassium; Take 1 tablet (25 mg total) by mouth daily.  Dispense: 90 tablet; Refill: 1  Chronic eczematous otitis externa of both ears -     Neomycin-Polymyxin-HC; Place 3 drops into both ears 3 (three) times daily.  Dispense: 10 mL; Refill: 1  Encounter for general adult medical examination with abnormal findings - Exam completed, labs reviewed, vaccines reviewed, cancer screenings  are UTD, pt ed material was given.   Hyperlipidemia LDL goal <100 -     Atorvastatin Calcium; Take 1 tablet (20 mg total) by mouth daily.  Dispense: 90 tablet; Refill: 1     Follow-up: Return in about 6 months (around 01/27/2024).  Sanda Linger, MD

## 2023-08-06 ENCOUNTER — Encounter: Payer: Self-pay | Admitting: Cardiology

## 2023-09-08 ENCOUNTER — Other Ambulatory Visit (HOSPITAL_COMMUNITY): Payer: Self-pay

## 2023-09-12 ENCOUNTER — Other Ambulatory Visit: Payer: Self-pay

## 2023-09-13 ENCOUNTER — Other Ambulatory Visit (HOSPITAL_COMMUNITY): Payer: Self-pay

## 2023-09-21 ENCOUNTER — Encounter: Payer: Self-pay | Admitting: Cardiology

## 2023-10-02 ENCOUNTER — Other Ambulatory Visit (HOSPITAL_COMMUNITY): Payer: Self-pay

## 2023-10-06 ENCOUNTER — Telehealth: Payer: Self-pay

## 2023-10-06 NOTE — Telephone Encounter (Signed)
**Note De-Identified [facility] Obfuscation** Ordering provider: Dr Bjorn Pippin Associated diagnoses: Snoring-R06.83 and Somnolence-R40.0 WatchPAT PA obtained on 10/06/2023 by Uriel Horkey, Lorelle Formosa, LPN. Authorization: Per [employer] Provider Portal: Procedure Code: 16109 NO AUTH REQUIRED  Patient notified of PIN ([phone]) on 10/06/2023 Lauralei Clouse Notification Method: MyChart message. I called the pt but got no answer so I left a message (OK per DPR) advising her of her Pin # and that I have also sent her a [facility] message with her Pin #. I did leave my name and office phone number so she can call back if she has any questions or concerns.  Phone note routed to covering staff for follow-up.

## 2023-10-10 ENCOUNTER — Encounter (INDEPENDENT_AMBULATORY_CARE_PROVIDER_SITE_OTHER): Payer: Self-pay | Admitting: Cardiology

## 2023-10-10 DIAGNOSIS — G4710 Hypersomnia, unspecified: Secondary | ICD-10-CM | POA: Diagnosis not present

## 2023-10-10 DIAGNOSIS — G4719 Other hypersomnia: Secondary | ICD-10-CM

## 2023-10-10 DIAGNOSIS — R0683 Snoring: Secondary | ICD-10-CM | POA: Diagnosis not present

## 2023-10-13 NOTE — Telephone Encounter (Signed)
Called and left VM on patient's personal voicemail to call office for [employer] information.

## 2023-10-13 NOTE — Telephone Encounter (Signed)
Patient returned the call and stated she completed her [facility] study and had no further questions. Advised patient that provider would receive results and someone if needed would follow up with her.

## 2023-10-14 ENCOUNTER — Encounter: Payer: Self-pay | Admitting: Cardiology

## 2023-10-15 ENCOUNTER — Ambulatory Visit: Payer: 59 | Attending: Cardiology

## 2023-10-15 DIAGNOSIS — R400 Somnolence: Secondary | ICD-10-CM

## 2023-10-15 DIAGNOSIS — R0683 Snoring: Secondary | ICD-10-CM

## 2023-10-15 LAB — HM DIABETES EYE EXAM

## 2023-10-15 NOTE — Progress Notes (Signed)
Notified patient of sleep study results and recommendations. All questions were answered and patient verbalized understanding.

## 2023-10-15 NOTE — Procedures (Signed)
   SLEEP STUDY REPORT Patient Information Study Date: 10/10/2023 Patient Name: Shelly Whitehead Patient ID: [id number] Birth Date: 05-07-1966 Age: 56 Gender: Female BMI: 34.6 (W=220 lb, H=5' 7'') Referring Physician: Epifanio Lesches, MD  TEST DESCRIPTION: Home sleep apnea testing was completed using the WatchPat, a Type 1 device, utilizing peripheral arterial tonometry (PAT), chest movement, actigraphy, pulse oximetry, pulse rate, body position and snore. AHI was calculated with apnea and hypopnea using valid sleep time as the denominator. RDI includes apneas, hypopneas, and RERAs. The data acquired and the scoring of sleep and all associated events were performed in accordance with the recommended standards and specifications as outlined in the AASM Manual for the Scoring of Sleep and Associated Events 2.2.0 (2015).  FINDINGS: 1. No evidence of Obstructive Sleep Apnea with AHI 2.2/hr. 2. No Central Sleep Apnea. 3. Oxygen desaturations as low as 86%. 4. Mild snoring was present. O2 sats were < 88% for 1.6 minutes. 5. Total sleep time was 7 hrs and 47 min. 6. 29.5% of total sleep time was spent in REM sleep. 7. Shortened sleep onset latency at 5 min. 8. Normal REM sleep onset latency at 89 min. 9. Total awakenings were 8.  DIAGNOSIS: Normal study with no significant sleep disordered breathing.  RECOMMENDATIONS: 1. Normal study with no significant sleep disordered breathing.  2. Healthy sleep recommendations include: adequate nightly sleep (normal 7-9 hrs/night), avoidance of caffeine after noon and alcohol near bedtime, and maintaining a sleep environment that is cool, dark and quiet.  3. Weight loss for overweight patients is recommended.  4. Snoring recommendations include: weight loss where appropriate, side sleeping, and avoidance of alcohol before bed.  5. Operation of motor vehicle or dangerous equipment must be avoided when feeling drowsy, excessively sleepy,  or mentally fatigued.  6. An ENT consultation which may be useful for specific causes of and possible treatment of bothersome snoring .  7. Weight loss may be of benefit in reducing the severity of snoring.   Signature: Armanda Magic, MD; [facility]; Diplomat, American Board of Sleep Medicine Electronically Signed: 10/15/2023 10:07:43 AM

## 2023-10-15 NOTE — Procedures (Signed)
Erroneous encounter

## 2023-10-15 NOTE — Progress Notes (Signed)
This encounter was created in error - please disregard.

## 2023-10-17 ENCOUNTER — Encounter: Payer: Self-pay | Admitting: Endocrinology

## 2023-10-22 ENCOUNTER — Encounter: Payer: Self-pay | Admitting: Internal Medicine

## 2023-10-28 ENCOUNTER — Other Ambulatory Visit: Payer: Self-pay

## 2023-10-29 ENCOUNTER — Other Ambulatory Visit (HOSPITAL_COMMUNITY): Payer: Self-pay

## 2023-10-30 ENCOUNTER — Ambulatory Visit: Payer: 59 | Admitting: Internal Medicine

## 2023-12-07 ENCOUNTER — Other Ambulatory Visit: Payer: Self-pay | Admitting: Cardiology

## 2023-12-09 ENCOUNTER — Other Ambulatory Visit (HOSPITAL_COMMUNITY): Payer: Self-pay

## 2023-12-09 MED ORDER — METOPROLOL SUCCINATE ER 50 MG PO TB24
50.0000 mg | ORAL_TABLET | Freq: Every day | ORAL | 2 refills | Status: DC
  Filled 2023-12-09: qty 90, 90d supply, fill #0
  Filled 2024-03-09: qty 90, 90d supply, fill #1

## 2023-12-10 ENCOUNTER — Other Ambulatory Visit (HOSPITAL_COMMUNITY): Payer: Self-pay

## 2023-12-22 ENCOUNTER — Other Ambulatory Visit (HOSPITAL_COMMUNITY): Payer: Self-pay

## 2023-12-23 ENCOUNTER — Other Ambulatory Visit (HOSPITAL_COMMUNITY): Payer: Self-pay

## 2024-01-08 ENCOUNTER — Encounter: Payer: Self-pay | Admitting: Internal Medicine

## 2024-01-08 ENCOUNTER — Ambulatory Visit (INDEPENDENT_AMBULATORY_CARE_PROVIDER_SITE_OTHER): Payer: 59 | Admitting: Internal Medicine

## 2024-01-08 ENCOUNTER — Other Ambulatory Visit (HOSPITAL_COMMUNITY): Payer: Self-pay

## 2024-01-08 VITALS — BP 122/84 | HR 83 | Ht 67.0 in | Wt 217.4 lb

## 2024-01-08 DIAGNOSIS — E113399 Type 2 diabetes mellitus with moderate nonproliferative diabetic retinopathy without macular edema, unspecified eye: Secondary | ICD-10-CM

## 2024-01-08 DIAGNOSIS — Z794 Long term (current) use of insulin: Secondary | ICD-10-CM | POA: Diagnosis not present

## 2024-01-08 LAB — POCT GLYCOSYLATED HEMOGLOBIN (HGB A1C): Hemoglobin A1C: 5.7 % — AB (ref 4.0–5.6)

## 2024-01-08 MED ORDER — DEXCOM G7 SENSOR MISC
1.0000 | 3 refills | Status: DC
  Filled 2024-01-08 – 2024-03-17 (×2): qty 9, 90d supply, fill #0
  Filled 2024-07-09 – 2024-07-14 (×2): qty 3, 30d supply, fill #1
  Filled 2024-08-10: qty 3, 30d supply, fill #2
  Filled 2024-09-09: qty 3, 30d supply, fill #3
  Filled 2024-09-26 – 2024-10-02 (×2): qty 3, 30d supply, fill #4
  Filled 2024-10-26: qty 3, 30d supply, fill #5

## 2024-01-08 MED ORDER — SEMAGLUTIDE (2 MG/DOSE) 8 MG/3ML ~~LOC~~ SOPN
2.0000 mg | PEN_INJECTOR | SUBCUTANEOUS | 3 refills | Status: DC
  Filled 2024-01-08 – 2024-01-18 (×2): qty 9, 84d supply, fill #0
  Filled 2024-01-19: qty 3, 28d supply, fill #0
  Filled 2024-02-15: qty 3, 28d supply, fill #1
  Filled 2024-03-09: qty 3, 28d supply, fill #2
  Filled 2024-04-04: qty 3, 28d supply, fill #3
  Filled 2024-05-05: qty 3, 28d supply, fill #4

## 2024-01-08 NOTE — Patient Instructions (Signed)
-  Stop  Lantus 12  units daily  -Continue Ozempic 1 mg weekly    HOW TO TREAT LOW BLOOD SUGARS (Blood sugar LESS THAN 70 MG/DL) Please follow the RULE OF 15 for the treatment of hypoglycemia treatment (when your (blood sugars are less than 70 mg/dL)   STEP 1: Take 15 grams of carbohydrates when your blood sugar is low, which includes:  3-4 GLUCOSE TABS  OR 3-4 OZ OF JUICE OR REGULAR SODA OR ONE TUBE OF GLUCOSE GEL    STEP 2: RECHECK blood sugar in 15 MINUTES STEP 3: If your blood sugar is still low at the 15 minute recheck --> then, go back to STEP 1 and treat AGAIN with another 15 grams of carbohydrates.

## 2024-01-08 NOTE — Progress Notes (Signed)
 Name: Shelly Whitehead  MRN/ DOB: [id number], [birth year]   Age/ Sex: 58 y.o., female    PCP: Etta Grandchild, MD   Reason for Endocrinology Evaluation: Type 2 Diabetes Mellitus     Date of Initial Endocrinology Visit: 12/03/2021    PATIENT IDENTIFIER: [name] is a 58 y.o. female with a past medical history of T2DM, dyslipidemia, Hx of PCOS and migraine headaches. The patient presented for initial endocrinology clinic visit on 12/03/2021 for consultative assistance with her diabetes management.    HPI: [name] was    Diagnosed with DM 2015 Prior Medications tried/Intolerance: Metformin- diarrhea . Bydureon - renal function was low ? But no side effects . Trulicity -nausea                 Hemoglobin A1c has ranged from 6.6% in 2019, peaking at 7.5% in 2021.     On her initial visit to our clinic she had an A1c of 7.8%, she was on Levemir and Jardiance but she was having monthly yeast infection requiring monthly Diflucan prescriptions.  We stopped Jardiance and started Ozempic  She stopped Ozempic by September 2023 because she was under the impression that this was hurting her kidneys   SUBJECTIVE:   During the last visit (04/24/2023): A1c 6.0 %      Today (01/08/24): Shelly Whitehead is here for follow-up on diabetes management.  She checks her blood sugars multiple times daily, through Dexcom . The patient has  had hypoglycemic episodes since the last clinic visit, which typically occur at night .  She is asymptomatic with his episodes.   Denies nausea, vomiting  Has occasional  constipation with   diarrhea  Denies heartburn   HOME DIABETES REGIMEN: Lantus  12 units daily  Ozempic 2 mg weekly        Statin: yes ACE-I/ARB: yes Prior Diabetic Education: yes    CONTINUOUS GLUCOSE MONITORING RECORD INTERPRETATION    Dates of Recording: 2/14-2/27/2025  Sensor description: dexcom  Results statistics:   CGM use % of time 91  Average and SD 125/29  Time in  range 95 %  % Time Above 180 5  % Time above 250 0  % Time Below target 0      Glycemic patterns summary: BG's optimal at night , and during the day   Hyperglycemic episodes: Rare postprandial  Hypoglycemic episodes occurred at night and during the day at times  Overnight periods: Trends down     DIABETIC COMPLICATIONS: Microvascular complications:   Denies: CKD, retinopathy, neuropathy  Last eye exam: Completed 10/15/2023  Macrovascular complications:   Denies: CAD, PVD, CVA   PAST HISTORY: Past Medical History:  Past Medical History:  Diagnosis Date   Adhesive capsulitis of right shoulder    ASCUS of cervix with negative high risk HPV    Diabetes mellitus without complication (HCC)    Migraine with aura and with status migrainosus, not intractable    Postmenopausal bleeding    SVT (supraventricular tachycardia) (HCC)    Past Surgical History:  Past Surgical History:  Procedure Laterality Date   APPENDECTOMY     GALLBLADDER SURGERY  1998   SHOULDER SURGERY  2016    Social History:  reports that she has never smoked. She has never used smokeless tobacco. She reports current alcohol use of about 3.0 standard drinks of alcohol per week. She reports that she does not use drugs. Family History:  Family History  Problem Relation Age of Onset  Alzheimer's disease Mother    Cancer Father        Bladder   Breast cancer Neg Hx      HOME MEDICATIONS: Allergies as of 01/08/2024       Reactions   Jardiance [empagliflozin] Other (See Comments)   Yeast infection   Metformin Diarrhea, Nausea Only   Oxycodone Itching   Oxycodone-acetaminophen Itching        Medication List        Accurate as of January 08, 2024  9:06 AM. If you have any questions, ask your nurse or doctor.          atorvastatin 20 MG tablet Commonly known as: LIPITOR Take 1 tablet (20 mg total) by mouth daily.   B-D UF III MINI PEN NEEDLES 31G X 5 MM Misc Generic drug: Insulin  Pen Needle Use to inject insulin once a day in the afternoon.   Dexcom G7 Sensor Misc Apply 1 sensor every 10 days.   Lantus SoloStar 100 UNIT/ML Solostar Pen Generic drug: insulin glargine Inject 12 Units into the skin daily.   losartan 25 MG tablet Commonly known as: COZAAR Take 1 tablet (25 mg total) by mouth daily.   metoprolol succinate 50 MG 24 hr tablet Commonly known as: TOPROL-XL Take 1 tablet (50 mg total) by mouth daily.   NEOMYCIN-POLYMYXIN-HYDROCORTISONE 1 % Soln OTIC solution Commonly known as: CORTISPORIN Place 3 drops into both ears 3 (three) times daily.   Ozempic (2 MG/DOSE) 8 MG/3ML Sopn Generic drug: Semaglutide (2 MG/DOSE) Inject 2 mg as directed once a week.         ALLERGIES: Allergies  Allergen Reactions   Jardiance [Empagliflozin] Other (See Comments)    Yeast infection   Metformin Diarrhea and Nausea Only   Oxycodone Itching   Oxycodone-Acetaminophen Itching     REVIEW OF SYSTEMS: A comprehensive ROS was conducted with the patient and is negative except as per HPI    OBJECTIVE:   VITAL SIGNS: BP 122/84 (BP Location: Left Arm, Patient Position: Sitting, Cuff Size: Normal)   Pulse 83   Ht 5\' 7"  (1.702 m)   Wt 217 lb 6.4 oz (98.6 kg)   SpO2 99%   BMI 34.05 kg/m    PHYSICAL EXAM:  General: Pt appears well and is in NAD  Lungs: Clear with good BS bilat   Heart: RRR  Abdomen: Soft, nontender  Extremities:  Lower extremities - No pretibial edema.   Neuro: MS is good with appropriate affect, pt is alert and Ox3    DM foot exam: 04/24/2023  The skin of the feet is without sores or ulcerations. The pedal pulses are 2+ on right and 2+ on left. The sensation is intact to a screening 5.07, 10 gram monofilament bilaterally   DATA REVIEWED:  Lab Results  Component Value Date   HGBA1C 5.7 (A) 01/08/2024   HGBA1C 6.0 (A) 04/24/2023   HGBA1C 6.1 (A) 10/17/2022    Latest Reference Range & Units 07/30/23 10:42  Sodium 135 - 145  mEq/L 137  Potassium 3.5 - 5.1 mEq/L 4.4  Chloride 96 - 112 mEq/L 103  CO2 19 - 32 mEq/L 28  Glucose 70 - 99 mg/dL 161 (H)  BUN 6 - 23 mg/dL 16  Creatinine 0.96 - 0.45 mg/dL 4.09 (H)  Calcium 8.4 - 10.5 mg/dL 9.3  Alkaline Phosphatase 39 - 117 U/L 92  Albumin 3.5 - 5.2 g/dL 4.2  AST 0 - 37 U/L 25  ALT 0 - 35 U/L  27  Total Protein 6.0 - 8.3 g/dL 7.6  Bilirubin, Direct 0.0 - 0.3 mg/dL 0.1  Total Bilirubin 0.2 - 1.2 mg/dL 0.5  GFR >16.10 mL/min 48.36 (L)  (H): Data is abnormally high (L): Data is abnormally low  ASSESSMENT / PLAN / RECOMMENDATIONS:   1) Type 2 Diabetes Mellitus, Optimally controlled, With diabetic retinopathy complications - Most recent A1c of 5.7 %. Goal A1c < 7.0 %.     -A1c remains optimal  -I have recommended discontinuing insulin at this time and continuing with Ozempic only    MEDICATIONS:  -Stop lantus 12 units daily  - Continue Ozempic 2 mg weekly     EDUCATION / INSTRUCTIONS: BG monitoring instructions: Patient is instructed to check her blood sugars 3 times a day, before meals . Call [facility] if: BG persistently < 70  I reviewed the Rule of 15 for the treatment of hypoglycemia in detail with the patient. Literature supplied.   2) Diabetic complications:  Eye: Does not have known diabetic retinopathy.  Neuro/ Feet: Does not have known diabetic peripheral neuropathy. Renal: Patient does not have known baseline CKD. She is  on an ACEI/ARB at present.       Follow-up in 4 months       Signed electronically by: Lyndle Herrlich, MD  [facility]  [facility] [address] [place], [place] [address] Phone: [phone] FAX: [phone]   CC: Etta Grandchild, MD [address] [place] [address] Phone: [phone]  Fax: [phone]    Return to Endocrinology clinic as below: Future Appointments  Date Time Provider Department Center  01/08/2024  9:10 AM  Tashia Leiterman, Konrad Dolores, MD [id number] None

## 2024-01-19 ENCOUNTER — Other Ambulatory Visit: Payer: Self-pay

## 2024-01-19 ENCOUNTER — Other Ambulatory Visit (HOSPITAL_COMMUNITY): Payer: Self-pay

## 2024-01-19 ENCOUNTER — Other Ambulatory Visit: Payer: Self-pay | Admitting: Internal Medicine

## 2024-01-19 DIAGNOSIS — N182 Chronic kidney disease, stage 2 (mild): Secondary | ICD-10-CM

## 2024-01-19 DIAGNOSIS — E785 Hyperlipidemia, unspecified: Secondary | ICD-10-CM

## 2024-01-20 ENCOUNTER — Encounter: Payer: Self-pay | Admitting: Internal Medicine

## 2024-01-20 ENCOUNTER — Other Ambulatory Visit (HOSPITAL_COMMUNITY): Payer: Self-pay

## 2024-01-20 ENCOUNTER — Other Ambulatory Visit: Payer: Self-pay | Admitting: Internal Medicine

## 2024-01-20 DIAGNOSIS — N182 Chronic kidney disease, stage 2 (mild): Secondary | ICD-10-CM

## 2024-01-20 MED ORDER — LOSARTAN POTASSIUM 25 MG PO TABS
25.0000 mg | ORAL_TABLET | Freq: Every day | ORAL | 3 refills | Status: DC
  Filled 2024-01-20: qty 90, 90d supply, fill #0

## 2024-01-20 NOTE — Telephone Encounter (Signed)
 LVM to schedule patient for overdue F/U prior to refills

## 2024-01-22 ENCOUNTER — Other Ambulatory Visit (HOSPITAL_COMMUNITY): Payer: Self-pay

## 2024-01-22 MED ORDER — ATORVASTATIN CALCIUM 20 MG PO TABS
20.0000 mg | ORAL_TABLET | Freq: Every day | ORAL | 1 refills | Status: DC
Start: 2024-01-22 — End: 2024-08-31
  Filled 2024-01-22: qty 90, 90d supply, fill #0
  Filled 2024-04-21: qty 30, 30d supply, fill #1
  Filled 2024-06-11: qty 30, 30d supply, fill #2
  Filled 2024-07-12: qty 30, 30d supply, fill #3

## 2024-02-19 ENCOUNTER — Ambulatory Visit (INDEPENDENT_AMBULATORY_CARE_PROVIDER_SITE_OTHER): Admitting: Internal Medicine

## 2024-02-19 ENCOUNTER — Encounter: Payer: Self-pay | Admitting: Internal Medicine

## 2024-02-19 VITALS — BP 106/66 | HR 72 | Temp 98.7°F | Resp 16 | Ht 67.0 in | Wt 212.6 lb

## 2024-02-19 DIAGNOSIS — E785 Hyperlipidemia, unspecified: Secondary | ICD-10-CM | POA: Diagnosis not present

## 2024-02-19 DIAGNOSIS — N182 Chronic kidney disease, stage 2 (mild): Secondary | ICD-10-CM

## 2024-02-19 DIAGNOSIS — Z794 Long term (current) use of insulin: Secondary | ICD-10-CM

## 2024-02-19 DIAGNOSIS — E119 Type 2 diabetes mellitus without complications: Secondary | ICD-10-CM

## 2024-02-19 DIAGNOSIS — I950 Idiopathic hypotension: Secondary | ICD-10-CM | POA: Diagnosis not present

## 2024-02-19 DIAGNOSIS — D225 Melanocytic nevi of trunk: Secondary | ICD-10-CM | POA: Insufficient documentation

## 2024-02-19 DIAGNOSIS — Z0000 Encounter for general adult medical examination without abnormal findings: Secondary | ICD-10-CM

## 2024-02-19 DIAGNOSIS — Z124 Encounter for screening for malignant neoplasm of cervix: Secondary | ICD-10-CM

## 2024-02-19 DIAGNOSIS — Z0001 Encounter for general adult medical examination with abnormal findings: Secondary | ICD-10-CM

## 2024-02-19 LAB — URINALYSIS, ROUTINE W REFLEX MICROSCOPIC
Bilirubin Urine: NEGATIVE
Hgb urine dipstick: NEGATIVE
Ketones, ur: NEGATIVE
Leukocytes,Ua: NEGATIVE
Nitrite: NEGATIVE
RBC / HPF: NONE SEEN (ref 0–?)
Specific Gravity, Urine: 1.02 (ref 1.000–1.030)
Total Protein, Urine: NEGATIVE
Urine Glucose: NEGATIVE
Urobilinogen, UA: 0.2 (ref 0.0–1.0)
pH: 6 (ref 5.0–8.0)

## 2024-02-19 LAB — HEPATIC FUNCTION PANEL
ALT: 25 U/L (ref 0–35)
AST: 23 U/L (ref 0–37)
Albumin: 4.4 g/dL (ref 3.5–5.2)
Alkaline Phosphatase: 104 U/L (ref 39–117)
Bilirubin, Direct: 0.1 mg/dL (ref 0.0–0.3)
Total Bilirubin: 0.6 mg/dL (ref 0.2–1.2)
Total Protein: 7.7 g/dL (ref 6.0–8.3)

## 2024-02-19 LAB — MICROALBUMIN / CREATININE URINE RATIO
Creatinine,U: 153.3 mg/dL
Microalb Creat Ratio: UNDETERMINED mg/g (ref 0.0–30.0)
Microalb, Ur: <0.7 mg/dL

## 2024-02-19 LAB — BASIC METABOLIC PANEL WITH GFR
BUN: 15 mg/dL (ref 6–23)
CO2: 29 meq/L (ref 19–32)
Calcium: 9.3 mg/dL (ref 8.4–10.5)
Chloride: 102 meq/L (ref 96–112)
Creatinine, Ser: 1.11 mg/dL (ref 0.40–1.20)
GFR: 55.02 mL/min — ABNORMAL LOW (ref >60.00)
Glucose, Bld: 152 mg/dL — ABNORMAL HIGH (ref 70–99)
Potassium: 4.5 meq/L (ref 3.5–5.1)
Sodium: 137 meq/L (ref 135–145)

## 2024-02-19 LAB — CBC WITH DIFFERENTIAL/PLATELET
Basophils Absolute: 0 10*3/uL (ref 0.0–0.1)
Basophils Relative: 0.7 % (ref 0.0–3.0)
Eosinophils Absolute: 0.1 10*3/uL (ref 0.0–0.7)
Eosinophils Relative: 2.1 % (ref 0.0–5.0)
HCT: 44.5 % (ref 36.0–46.0)
Hemoglobin: 15 g/dL (ref 12.0–15.0)
Lymphocytes Relative: 34.8 % (ref 12.0–46.0)
Lymphs Abs: 1.6 10*3/uL (ref 0.7–4.0)
MCHC: 33.7 g/dL (ref 30.0–36.0)
MCV: 91.6 fl (ref 78.0–100.0)
Monocytes Absolute: 0.4 10*3/uL (ref 0.1–1.0)
Monocytes Relative: 7.5 % (ref 3.0–12.0)
Neutro Abs: 2.6 10*3/uL (ref 1.4–7.7)
Neutrophils Relative %: 54.9 % (ref 43.0–77.0)
Platelets: 253 10*3/uL (ref 150.0–400.0)
RBC: 4.86 Mil/uL (ref 3.87–5.11)
RDW: 12.5 % (ref 11.5–15.5)
WBC: 4.7 10*3/uL (ref 4.0–10.5)

## 2024-02-19 LAB — TSH: TSH: 2.08 u[IU]/mL (ref 0.35–5.50)

## 2024-02-19 LAB — LIPID PANEL
Cholesterol: 117 mg/dL (ref 0–200)
HDL: 48 mg/dL (ref >39.00)
LDL Cholesterol: 47 mg/dL (ref 0–99)
NonHDL: 68.95
Total CHOL/HDL Ratio: 2
Triglycerides: 108 mg/dL (ref 0.0–149.0)
VLDL: 21.6 mg/dL (ref 0.0–40.0)

## 2024-02-19 LAB — CORTISOL: Cortisol, Plasma: 11.5 ug/dL

## 2024-02-19 NOTE — Patient Instructions (Signed)

## 2024-02-19 NOTE — Progress Notes (Unsigned)
 Subjective:  Patient ID: Shelly Whitehead, female    DOB: [birth year]  Age: 58 y.o. MRN: [id number]  CC: Annual Exam, Hyperlipidemia, and Diabetes   HPI Shelly Whitehead presents for a CPX and f/up ----  Discussed the use of AI scribe software for clinical note transcription with the patient, who gave verbal consent to proceed.  History of Present Illness   Shelly Whitehead is a 58 year old female who presents for prescription refills and evaluation of low blood pressure.  She reports recent blood pressure readings of 106/66 mmHg and 102/64 mmHg, which are lower than her previous reading of 122/84 mmHg in February. No symptoms of weakness, dizziness, lightheadedness, palpitations, or chest pain. She also denies feeling dehydrated or having any other symptoms that might explain the drop in blood pressure.  She has a history of supraventricular tachycardia but denies any recent symptoms or palpitations. Her last EKG was in September of the previous year, and she only needs to see her cardiologist once a year now. She is reluctant to undergo an EKG today.  Regarding her diabetes management, her A1c is 5.7, and she is off insulin. She notes that recent vaccinations, including MMR and TDAP, caused her blood sugar to spike significantly. She received the TDAP vaccine about five weeks ago.  She experiences some gastrointestinal symptoms, specifically constipation, which she attributes to her use of Ozempic. However, she does not require treatment for constipation.  She is not interested in undergoing any lab work today, including kidney function tests, and prefers to wait until her annual physical in September.       Outpatient Medications Prior to Visit  Medication Sig Dispense Refill   atorvastatin (LIPITOR) 20 MG tablet Take 1 tablet (20 mg total) by mouth daily. 90 tablet 1   Continuous Glucose Sensor (DEXCOM G7 SENSOR) MISC Apply 1 sensor every 10 days. 9 each 3   losartan (COZAAR) 25 MG tablet  Take 1 tablet (25 mg total) by mouth daily. 90 tablet 3   metoprolol succinate (TOPROL-XL) 50 MG 24 hr tablet Take 1 tablet (50 mg total) by mouth daily. 90 tablet 2   Semaglutide, 2 MG/DOSE, 8 MG/3ML SOPN Inject 2 mg as directed once a week. 9 mL 3   neomycin-polymyxin-hydrocortisone (CORTISPORIN) OTIC solution Place 3 drops into both ears 3 (three) times daily. 10 mL 1   No facility-administered medications prior to visit.    ROS Review of Systems  Constitutional: Negative.  Negative for diaphoresis and fatigue.  HENT: Negative.    Eyes: Negative.   Respiratory:  Negative for cough, chest tightness, shortness of breath and wheezing.   Cardiovascular:  Negative for chest pain, palpitations and leg swelling.  Gastrointestinal:  Positive for constipation. Negative for abdominal pain, diarrhea, nausea and vomiting.  Endocrine: Negative.   Genitourinary: Negative.  Negative for difficulty urinating.  Musculoskeletal:  Negative for myalgias.  Neurological: Negative.  Negative for dizziness, weakness and light-headedness.  Hematological:  Negative for adenopathy. Does not bruise/bleed easily.  Psychiatric/Behavioral: Negative.      Objective:  BP 106/66 (BP Location: Left Arm, Patient Position: Sitting, Cuff Size: Normal)   Pulse 72   Temp 98.7 F (37.1 C) (Temporal)   Resp 16   Ht 5\' 7"  (1.702 m)   Wt 212 lb 9.6 oz (96.4 kg)   SpO2 99%   BMI 33.30 kg/m   BP Readings from Last 3 Encounters:  02/19/24 106/66  01/08/24 122/84  07/30/23 116/78    Wt Readings from  Last 3 Encounters:  02/19/24 212 lb 9.6 oz (96.4 kg)  01/08/24 217 lb 6.4 oz (98.6 kg)  07/30/23 216 lb (98 kg)    Physical Exam Vitals reviewed.  Constitutional:      Appearance: Normal appearance.  HENT:     Nose: Nose normal.     Mouth/Throat:     Mouth: Mucous membranes are moist.  Eyes:     General: No scleral icterus.    Conjunctiva/sclera: Conjunctivae normal.  Cardiovascular:     Rate and Rhythm:  Normal rate and regular rhythm.     Heart sounds: No murmur heard.    No friction rub. No gallop.     Comments: She deferred on an EKG today Pulmonary:     Effort: Pulmonary effort is normal.     Breath sounds: No stridor. No wheezing, rhonchi or rales.  Abdominal:     General: Abdomen is flat.     Palpations: There is no mass.     Tenderness: There is no abdominal tenderness. There is no guarding.     Hernia: No hernia is present.  Musculoskeletal:        General: Normal range of motion.     Cervical back: Neck supple. No tenderness.     Right lower leg: No edema.     Left lower leg: No edema.  Lymphadenopathy:     Cervical: No cervical adenopathy.  Skin:    General: Skin is warm and dry.  Neurological:     General: No focal deficit present.     Mental Status: She is alert.  Psychiatric:        Mood and Affect: Mood normal.        Behavior: Behavior normal.     Lab Results  Component Value Date   WBC 4.7 02/19/2024   HGB 15.0 02/19/2024   HCT 44.5 02/19/2024   PLT 253.0 02/19/2024   GLUCOSE 152 (H) 02/19/2024   CHOL 117 02/19/2024   TRIG 108.0 02/19/2024   HDL 48.00 02/19/2024   LDLCALC 47 02/19/2024   ALT 25 02/19/2024   AST 23 02/19/2024   NA 137 02/19/2024   K 4.5 02/19/2024   CL 102 02/19/2024   CREATININE 1.11 02/19/2024   BUN 15 02/19/2024   CO2 29 02/19/2024   TSH 2.08 02/19/2024   HGBA1C 5.7 (A) 01/08/2024   MICROALBUR <0.7 02/19/2024    MM 3D SCREENING MAMMOGRAM BILATERAL BREAST Result Date: 04/08/2023 CLINICAL DATA:  Screening. EXAM: DIGITAL SCREENING BILATERAL MAMMOGRAM WITH TOMOSYNTHESIS AND CAD TECHNIQUE: Bilateral screening digital craniocaudal and mediolateral oblique mammograms were obtained. Bilateral screening digital breast tomosynthesis was performed. The images were evaluated with computer-aided detection. COMPARISON:  Previous exam(s). ACR Breast Density Category b: There are scattered areas of fibroglandular density. FINDINGS: There are  no findings suspicious for malignancy. IMPRESSION: No mammographic evidence of malignancy. A result letter of this screening mammogram will be mailed directly to the patient. RECOMMENDATION: Screening mammogram in one year. (Code:SM-B-01Y) BI-RADS CATEGORY  1: Negative. Electronically Signed   By: Frederico Hamman M.D.   On: 04/08/2023 12:23    Assessment & Plan:  Chronic renal disease, stage 2, mildly decreased glomerular filtration rate (GFR) between 60-89 mL/min/1.73 square meter -     CBC with Differential/Platelet; Future -     Basic metabolic panel with GFR; Future -     Urinalysis, Routine w reflex microscopic; Future -     Microalbumin / creatinine urine ratio; Future  Encounter for general adult medical  examination with abnormal findings  Type 2 diabetes mellitus without complication, with long-term current use of insulin (HCC) -     Basic metabolic panel with GFR; Future -     Hepatic function panel; Future -     Urinalysis, Routine w reflex microscopic; Future -     Microalbumin / creatinine urine ratio; Future  Dyslipidemia, goal LDL below 70 -     Lipid panel; Future -     Hepatic function panel; Future -     TSH; Future  Idiopathic hypotension -     Cortisol; Future -     TSH; Future     Follow-up: Return in about 6 months (around 08/20/2024).  Sanda Linger, MD

## 2024-02-20 DIAGNOSIS — Z124 Encounter for screening for malignant neoplasm of cervix: Secondary | ICD-10-CM | POA: Insufficient documentation

## 2024-03-05 ENCOUNTER — Other Ambulatory Visit: Payer: Self-pay | Admitting: Internal Medicine

## 2024-03-05 DIAGNOSIS — Z1231 Encounter for screening mammogram for malignant neoplasm of breast: Secondary | ICD-10-CM

## 2024-03-17 ENCOUNTER — Other Ambulatory Visit (HOSPITAL_COMMUNITY): Payer: Self-pay

## 2024-03-18 ENCOUNTER — Other Ambulatory Visit (HOSPITAL_COMMUNITY): Payer: Self-pay

## 2024-04-09 ENCOUNTER — Ambulatory Visit
Admission: RE | Admit: 2024-04-09 | Discharge: 2024-04-09 | Disposition: A | Discharge status: Home / Self Care | Source: Ambulatory Visit

## 2024-04-09 DIAGNOSIS — Z1231 Encounter for screening mammogram for malignant neoplasm of breast: Secondary | ICD-10-CM

## 2024-04-12 ENCOUNTER — Other Ambulatory Visit (HOSPITAL_COMMUNITY): Payer: Self-pay

## 2024-04-12 ENCOUNTER — Ambulatory Visit (HOSPITAL_COMMUNITY)
Admission: RE | Admit: 2024-04-12 | Discharge: 2024-04-12 | Disposition: A | Discharge status: Home / Self Care | Source: Ambulatory Visit | Attending: Family Medicine | Admitting: Family Medicine

## 2024-04-12 ENCOUNTER — Telehealth: Payer: Self-pay

## 2024-04-12 ENCOUNTER — Other Ambulatory Visit: Payer: Self-pay

## 2024-04-12 ENCOUNTER — Telehealth: Payer: Self-pay | Admitting: Internal Medicine

## 2024-04-12 ENCOUNTER — Encounter: Payer: Self-pay | Admitting: Internal Medicine

## 2024-04-12 ENCOUNTER — Encounter (HOSPITAL_COMMUNITY): Payer: Self-pay

## 2024-04-12 VITALS — BP 106/76 | HR 79 | Temp 98.1°F | Resp 18

## 2024-04-12 DIAGNOSIS — W57XXXA Bitten or stung by nonvenomous insect and other nonvenomous arthropods, initial encounter: Secondary | ICD-10-CM

## 2024-04-12 DIAGNOSIS — R21 Rash and other nonspecific skin eruption: Secondary | ICD-10-CM

## 2024-04-12 MED ORDER — DOXYCYCLINE HYCLATE 100 MG PO CAPS
100.0000 mg | ORAL_CAPSULE | Freq: Two times a day (BID) | ORAL | 0 refills | Status: DC
  Filled 2024-04-12: qty 14, 7d supply, fill #0

## 2024-04-12 NOTE — Telephone Encounter (Signed)
 PA needed on Ozempic  with new insurance

## 2024-04-12 NOTE — Telephone Encounter (Signed)
 MEDICATION:  Semaglutide  (2 MG/DOSE) Semaglutide , 2 MG/DOSE, 8 MG/3ML SOPN  PHARMACY:   [facility] [facility] (Ph: [phone])   HAS THE PATIENT CONTACTED THEIR PHARMACY?  Yes  IS THIS A 90 DAY SUPPLY : Yes  IS PATIENT OUT OF MEDICATION: No  IF NOT; HOW MUCH IS LEFT: 5 Weeks  LAST APPOINTMENT DATE: @2 /27/2025  NEXT APPOINTMENT DATE:@7 /01/2024  DO WE HAVE YOUR PERMISSION TO LEAVE A DETAILED MESSAGE?: Yes  OTHER COMMENTS: Patient has new insurance effective 04/11/2024 [facility]) and the information is in EPIC   **Let patient know to contact pharmacy at the end of the day to make sure medication is ready. **  ** Please notify patient to allow 48-72 hours to process**  **Encourage patient to contact the pharmacy for refills or they can request refills through [facility]**

## 2024-04-12 NOTE — Telephone Encounter (Signed)
 Pharmacy Patient Advocate Encounter   Received notification from Pt Calls Messages that prior authorization for Ozempic  8mg  is required/requested.   Insurance verification completed.   The patient is insured through [facility] .   Per test claim: PA required; PA submitted to above mentioned insurance via CoverMyMeds Key/confirmation #/EOC [id number] Status is pending

## 2024-04-12 NOTE — ED Triage Notes (Signed)
 PT has multiple Bird Mite bites. P tis concerned with the bite on the RT foot. Pt is diabetic an dhas red areas on sole of foot.

## 2024-04-14 ENCOUNTER — Other Ambulatory Visit: Payer: Self-pay | Admitting: Internal Medicine

## 2024-04-14 NOTE — ED Provider Notes (Signed)
 [facility]   [id number] 04/12/24 Arrival Time: 1300  ASSESSMENT & PLAN:  1. Rash   2. Insect bite, unspecified site, initial encounter    Does not look infected now. Will watch for s/s of infection and begin below if needed: Meds ordered this encounter  Medications   doxycycline (VIBRAMYCIN) 100 MG capsule    Sig: Take 1 capsule (100 mg total) by mouth 2 (two) times daily.    Dispense:  14 capsule    Refill:  0   Will follow up with PCP or here if worsening or failing to improve as anticipated. Reviewed expectations re: course of current medical issues. Questions answered. Outlined signs and symptoms indicating need for more acute intervention. Patient verbalized understanding. After Visit Summary given.   SUBJECTIVE:  Shelly Whitehead is a 58 y.o. female who presents with a skin complaint. PT has multiple Bird Mite bites. Pt is concerned with the bite on the RT foot. Pt is diabetic and has red areas on sole of foot. Denies bleeding/drainage. Denies fever.    OBJECTIVE: Vitals:   04/12/24 1338  BP: 106/76  Pulse: 79  Resp: 18  Temp: 98.1 F (36.7 C)  SpO2: 98%    General appearance: alert; no distress HEENT: [place]; AT Extremities: no edema; moves all extremities normally Skin: warm and dry; sub-cm area of skin thickening on mid to distal plantar foot without overlying erythema or signs of infection Psychological: alert and cooperative; normal mood and affect  Allergies  Allergen Reactions   Jardiance  [Empagliflozin ] Other (See Comments)    Yeast infection   Metformin Diarrhea and Nausea Only   Oxycodone Itching   Oxycodone-Acetaminophen Itching    Past Medical History:  Diagnosis Date   Adhesive capsulitis of right shoulder    ASCUS of cervix with negative high risk HPV    Diabetes mellitus without complication (HCC)    Migraine with aura and with status migrainosus, not intractable    Postmenopausal bleeding    SVT (supraventricular tachycardia)  (HCC)    Social History   Socioeconomic History   Marital status: Single    Spouse name: Not on file   Number of children: Not on file   Years of education: Not on file   Highest education level: Professional school degree (e.g., MD, DDS, DVM, JD)  Occupational History   Occupation: attorney  Tobacco Use   Smoking status: Never   Smokeless tobacco: Never  Vaping Use   Vaping status: Never Used  Substance and Sexual Activity   Alcohol use: Yes    Alcohol/week: 3.0 standard drinks of alcohol    Types: 3 Cans of beer per week   Drug use: Never   Sexual activity: Yes    Partners: Female    Comment: 1st intercourse 58yo  Other Topics Concern   Not on file  Social History Narrative   Tobacco use, amount per day now: N/A   Past tobacco use, amount per day:N/A   How many years did you use tobacco:N/A   Alcohol use (drinks per week):1   Diet:   Do you drink/eat things with caffeine: YES   Marital status:     SINGLE                             What year were you married?   Do you live in a house, apartment, assisted living, condo, trailer, etc.? HOUSE   Is it one or more stories?  YES   How many persons live in your home? ME   Do you have pets in your home?( please list) 2 CATS   Current or past profession: ATTORNEY   Do you exercise?              NO                    Type and how often?   Do you have a living will? YES   Do you have a DNR form?                                   If not, do you want to discuss one? YES   Do you have signed POA/HPOA forms?    YES                     If so, please bring to you appointment   Social Drivers of Health   Financial Resource Strain: Low Risk  (02/18/2024)   Overall Financial Resource Strain (CARDIA)    Difficulty of Paying Living Expenses: Not hard at all  Food Insecurity: No Food Insecurity (02/18/2024)   Hunger Vital Sign    Worried About Running Out of Food in the Last Year: Never true    Ran Out of Food in the Last Year: Never true   Transportation Needs: No Transportation Needs (02/18/2024)   PRAPARE - Administrator, Civil Service (Medical): No    Lack of Transportation (Non-Medical): No  Physical Activity: Sufficiently Active (02/18/2024)   Exercise Vital Sign    Days of Exercise per Week: 5 days    Minutes of Exercise per Session: 30 min  Stress: Stress Concern Present (02/18/2024)   [employer] of Occupational Health - Occupational Stress Questionnaire    Feeling of Stress : To some extent  Social Connections: Unknown (02/18/2024)   Social Connection and Isolation Panel [NHANES]    Frequency of Communication with Friends and Family: Patient declined    Frequency of Social Gatherings with Friends and Family: Patient declined    Attends Religious Services: Never    Diplomatic Services operational officer: Patient declined    Attends Engineer, structural: Not on file    Marital Status: Patient declined  Catering manager Violence: Not on file   Family History  Problem Relation Age of Onset   Alzheimer's disease Mother    Bladder Cancer Father    Breast cancer Neg Hx    BRCA 1/2 Neg Hx    Past Surgical History:  Procedure Laterality Date   APPENDECTOMY     GALLBLADDER SURGERY  1998   SHOULDER SURGERY  2016      Afton Albright, MD 04/14/24 [phone]

## 2024-04-15 ENCOUNTER — Other Ambulatory Visit: Payer: Self-pay | Admitting: Internal Medicine

## 2024-04-15 DIAGNOSIS — R928 Other abnormal and inconclusive findings on diagnostic imaging of breast: Secondary | ICD-10-CM

## 2024-04-17 ENCOUNTER — Other Ambulatory Visit (HOSPITAL_COMMUNITY): Payer: Self-pay

## 2024-04-20 ENCOUNTER — Encounter (HOSPITAL_BASED_OUTPATIENT_CLINIC_OR_DEPARTMENT_OTHER): Payer: Self-pay | Admitting: Cardiology

## 2024-04-21 ENCOUNTER — Other Ambulatory Visit: Payer: Self-pay | Admitting: *Deleted

## 2024-04-21 ENCOUNTER — Other Ambulatory Visit (HOSPITAL_COMMUNITY): Payer: Self-pay

## 2024-04-21 ENCOUNTER — Other Ambulatory Visit: Payer: Self-pay

## 2024-04-21 MED ORDER — METOPROLOL SUCCINATE ER 25 MG PO TB24
25.0000 mg | ORAL_TABLET | Freq: Every day | ORAL | 3 refills | Status: DC
  Filled 2024-04-21: qty 30, 30d supply, fill #0

## 2024-04-21 NOTE — Progress Notes (Signed)
 Called and message with Dr. Alda Amas recommendations. Toprol  XL 25mg  ordered and sent to patient pharmacy. Left message for patient  to call office for any questions.

## 2024-04-21 NOTE — Telephone Encounter (Signed)
 Recommend reducing Toprol -XL dose to 25 mg daily.  Would continue this dose for 1 week and if doing well and not having palpitations, can then discontinue.  Can we schedule follow-up appointment for sometime in the next few months?

## 2024-04-28 ENCOUNTER — Ambulatory Visit
Admission: RE | Admit: 2024-04-28 | Discharge: 2024-04-28 | Disposition: A | Discharge status: Home / Self Care | Source: Ambulatory Visit | Attending: Internal Medicine | Admitting: Internal Medicine

## 2024-04-28 ENCOUNTER — Ambulatory Visit

## 2024-04-28 DIAGNOSIS — R928 Other abnormal and inconclusive findings on diagnostic imaging of breast: Secondary | ICD-10-CM

## 2024-04-29 ENCOUNTER — Ambulatory Visit
Admission: RE | Admit: 2024-04-29 | Discharge: 2024-04-29 | Disposition: A | Discharge status: Home / Self Care | Source: Ambulatory Visit | Attending: Emergency Medicine | Admitting: Emergency Medicine

## 2024-04-29 ENCOUNTER — Other Ambulatory Visit (HOSPITAL_COMMUNITY): Payer: Self-pay

## 2024-04-29 VITALS — BP 118/79 | HR 85 | Temp 97.9°F | Resp 18

## 2024-04-29 DIAGNOSIS — S91301A Unspecified open wound, right foot, initial encounter: Secondary | ICD-10-CM

## 2024-04-29 DIAGNOSIS — S90861D Insect bite (nonvenomous), right foot, subsequent encounter: Secondary | ICD-10-CM

## 2024-04-29 DIAGNOSIS — W57XXXD Bitten or stung by nonvenomous insect and other nonvenomous arthropods, subsequent encounter: Secondary | ICD-10-CM

## 2024-04-29 MED ORDER — CEPHALEXIN 500 MG PO CAPS
500.0000 mg | ORAL_CAPSULE | Freq: Four times a day (QID) | ORAL | 0 refills | Status: DC
  Filled 2024-04-29: qty 20, 5d supply, fill #0

## 2024-04-29 NOTE — ED Triage Notes (Signed)
 Pt present with bird mite bites on the bottom of her rt foot. Pt states she has had the bites fro one month. She was taking abx on 06/07, temporarily improved and last night developed discomfort and itching.

## 2024-04-29 NOTE — ED Provider Notes (Signed)
 [facility]    CSN: [id number] Arrival date & time: 04/29/24  1055      History   Chief Complaint Chief Complaint  Patient presents with   Insect Bite    Entered by patient    HPI Shelly Whitehead is a 58 y.o. female.   Patient presents for evaluation of a persisting rash present to the bottom of the right foot beginning 1 month ago, known bird mite bites.  Was evaluated on 04/12/2024 in this urgent care, given antibiotic for any worsening symptoms, completed 4 to 5 days ago.  Endorses symptoms did improve until yesterday when she began to experience pain when bearing weight and pruritus.  Denies drainage or fever.  Has not attempted any additional treatment.    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, with long-term current use of insulin  (HCC) 04/24/2023   Chronic renal disease, stage 2, mildly decreased glomerular filtration rate (GFR) between 60-89 mL/min/1.73 square meter 07/25/2022   Encounter for general adult medical examination with abnormal findings 01/23/2022   Paroxysmal SVT (supraventricular tachycardia) (HCC) 01/22/2022   Class 2 severe obesity due to excess calories with serious comorbidity and body mass index (BMI) of 36.0 to 36.9 in adult [facility]) 05/13/2021   Chronic migraine 12/16/2017   Incomplete tear of right rotator cuff 01/26/2015    Past  Surgical History:  Procedure Laterality Date   APPENDECTOMY     GALLBLADDER SURGERY  1998   SHOULDER SURGERY  2016    OB History     Gravida  0   Para  0   Term  0   Preterm  0   AB  0   Living  0      SAB  0   IAB  0   Ectopic  0   Multiple  0   Live Births  0            Home Medications    Prior to Admission medications   Medication Sig Start Date End Date Taking? Authorizing Provider  atorvastatin  (LIPITOR) 20 MG tablet Take 1 tablet (20 mg total) by mouth daily. 01/22/24   Arcadio Knuckles, MD  Continuous Glucose Sensor (DEXCOM G7 SENSOR) MISC Apply 1 sensor every 10 days. 01/08/24   Shamleffer, Ibtehal Jaralla, MD  doxycycline  (VIBRAMYCIN ) 100 MG capsule Take 1 capsule (100 mg total) by mouth 2 (two) times daily. 04/12/24   Afton Albright, MD  losartan  (COZAAR ) 25 MG tablet Take 1 tablet (25 mg total) by mouth daily. 01/20/24   Shamleffer, Ibtehal Jaralla, MD  metoprolol  succinate (TOPROL  XL) 25 MG 24 hr tablet Take 1 tablet (25 mg total) by mouth daily. 04/21/24   Wendie Hamburg, MD  Semaglutide , 2 MG/DOSE, 8 MG/3ML SOPN Inject 2 mg as directed once a week. 01/08/24   Shamleffer, Ibtehal Jaralla, MD  insulin  detemir (LEVEMIR   FLEXPEN) 100 UNIT/ML FlexPen Inject 24 Units into the skin daily. Patient taking differently: Inject 20 Units into the skin daily. 03/29/22 10/17/22  Shamleffer, Julian Obey, MD    Family History Family History  Problem Relation Age of Onset   Alzheimer's disease Mother    Bladder Cancer Father    Breast cancer Neg Hx    BRCA 1/2 Neg Hx     Social History Social History   Tobacco Use   Smoking status: Never   Smokeless tobacco: Never  Vaping Use   Vaping status: Never Used  Substance Use Topics   Alcohol use: Yes    Alcohol/week: 3.0 standard drinks of alcohol    Types: 3 Cans of beer per week   Drug use: Never     Allergies   Jardiance  [empagliflozin ], Metformin, Oxycodone, and  Oxycodone-acetaminophen   Review of Systems Review of Systems   Physical Exam Triage Vital Signs ED Triage Vitals  Encounter Vitals Group     BP 04/29/24 1103 118/79     Girls Systolic BP Percentile --      Girls Diastolic BP Percentile --      Boys Systolic BP Percentile --      Boys Diastolic BP Percentile --      Pulse Rate 04/29/24 1103 85     Resp 04/29/24 1103 18     Temp 04/29/24 1103 97.9 F (36.6 C)     Temp Source 04/29/24 1103 Oral     SpO2 04/29/24 1103 97 %     Weight --      Height --      Head Circumference --      Peak Flow --      Pain Score 04/29/24 1102 0     Pain Loc --      Pain Education --      Exclude from Growth Chart --    No data found.  Updated Vital Signs BP 118/79 (BP Location: Right Arm)   Pulse 85   Temp 97.9 F (36.6 C) (Oral)   Resp 18   SpO2 97%   Visual Acuity Right Eye Distance:   Left Eye Distance:   Bilateral Distance:    Right Eye Near:   Left Eye Near:    Bilateral Near:     Physical Exam Constitutional:      Appearance: Normal appearance.   Eyes:     Extraocular Movements: Extraocular movements intact.   Pulmonary:     Effort: Pulmonary effort is normal.   Skin:    Comments: Scattered blistering over the plantar aspect of the right midfoot, serous fluid and pus filled fluid noted, no erythema present, 2+ pedal pulse   Neurological:     Mental Status: She is alert and oriented to person, place, and time. Mental status is at baseline.      UC Treatments / Results  Labs (all labs ordered are listed, but only abnormal results are displayed) Labs Reviewed - No data to display  EKG   Radiology MM 3D DIAGNOSTIC MAMMOGRAM UNILATERAL LEFT BREAST Result Date: 04/28/2024 CLINICAL DATA:  Callback from screening mammogram for possible asymmetry left breast. EXAM: DIGITAL DIAGNOSTIC UNILATERAL LEFT MAMMOGRAM WITH TOMOSYNTHESIS AND CAD TECHNIQUE: Left digital diagnostic mammography and breast tomosynthesis was  performed. The images were evaluated with computer-aided detection. COMPARISON:  Previous exam(s). ACR Breast Density Category b: There are scattered areas of fibroglandular density. FINDINGS: Lateral view of left breast, spot compression left MLO view are submitted. The previously  questioned asymmetry does not persist on additional views probably due to superimposed. On additional views, the breast is unchanged compared prior exam 2021. IMPRESSION: Benign findings. RECOMMENDATION: Recommend routine screening mammogram back on schedule. I have discussed the findings and recommendations with the patient. If applicable, a reminder letter will be sent to the patient regarding the next appointment. BI-RADS CATEGORY  2: Benign. Electronically Signed   By: Anna Barnes M.D.   On: 04/28/2024 14:11    Procedures Procedures (including critical care time)  Medications Ordered in UC Medications - No data to display  Initial Impression / Assessment and Plan / UC Course  I have reviewed the triage vital signs and the nursing notes.  Pertinent labs & imaging results that were available during my care of the patient were reviewed by me and considered in my medical decision making (see chart for details).  Right foot, insect bite of right foot subsequent encounter  Presentation concerning for infection, discussed, prescribed cephalexin, recent use of doxycycline  recommended supportive care and advised follow-up with primary doctor or urgent care for reevaluation Final Clinical Impressions(s) / UC Diagnoses   Final diagnoses:  None   Discharge Instructions   None    ED Prescriptions   None    PDMP not reviewed this encounter.   Reena Canning, NP 04/29/24 1120

## 2024-04-29 NOTE — Discharge Instructions (Signed)
 Today concerned infection still present to the right foot we will add on additional antibiotics  Take cephalexin every 6 hours as needed for 5 days  For pain May take Tylenol and or Motrin as needed  May hold warm compresses or complete warm soaks to the affected area 10 to 15-minute intervals  Do not pick or open the blistering on the foot as this can cause delays in healing however if it opens and drains on its own this is okay  If symptoms continue to persist please follow-up with your primary doctor or urgent care for reevaluation

## 2024-04-30 ENCOUNTER — Other Ambulatory Visit (INDEPENDENT_AMBULATORY_CARE_PROVIDER_SITE_OTHER): Payer: Self-pay

## 2024-04-30 ENCOUNTER — Ambulatory Visit: Admitting: Orthopaedic Surgery

## 2024-04-30 ENCOUNTER — Other Ambulatory Visit (HOSPITAL_COMMUNITY): Payer: Self-pay

## 2024-04-30 DIAGNOSIS — G8929 Other chronic pain: Secondary | ICD-10-CM | POA: Diagnosis not present

## 2024-04-30 DIAGNOSIS — M25512 Pain in left shoulder: Secondary | ICD-10-CM | POA: Diagnosis not present

## 2024-04-30 DIAGNOSIS — M542 Cervicalgia: Secondary | ICD-10-CM

## 2024-04-30 MED ORDER — NAPROXEN 500 MG PO TABS
500.0000 mg | ORAL_TABLET | Freq: Two times a day (BID) | ORAL | 3 refills | Status: DC
  Filled 2024-04-30: qty 30, 15d supply, fill #0

## 2024-04-30 NOTE — Progress Notes (Signed)
 Office Visit Note   Patient: Shelly Whitehead           Date of Birth: 08/04/1966           MRN: [id number] Visit Date: 04/30/2024              Requested by: Arcadio Knuckles, MD [address] [place],  [place] [address] PCP: Arcadio Knuckles, MD   Assessment & Plan: Visit Diagnoses:  1. Neck pain   2. Chronic left shoulder pain     Plan: History of Present Illness Shelly Whitehead is a 58 year old female with diabetes who presents with left arm and neck pain.  She has experienced pain in her left arm and neck for six months, with radiation from the midarm to the neck and shoulder. There is a limited range of motion in the shoulder, and the pain has progressively worsened. She occasionally feels as though her shoulder or arm might 'give out,' but this sensation is transient. There is no numbness or tingling in the arm or fingers. Her medical history includes well-controlled diabetes and previous rotator cuff and shoulder surgery on the right shoulder. She had a negative experience with a cortisone shot in the past.  Physical Exam MUSCULOSKELETAL: Shoulder flexion, abduction, and external rotation are normal. Pain with shoulder impingement sign and with supraspinatus and subscapularis testing.  Strength is normal.  Assessment and Plan Rotator cuff tendinitis Chronic left shoulder pain with limited range of motion and instability. Pain with impingement sign and during supraspinatus and subscapularis testing. Differential includes bursitis and partial rotator cuff tear. No cervical spine involvement suspected. Declined cortisone injection, opting for NSAIDs and physical therapy. - Prescribed Naprosyn for inflammation and pain management. - Referred to physical therapy for shoulder rehabilitation. - Advised follow-up if no improvement after six weeks for potential MRI evaluation.  Follow-Up Instructions: No follow-ups on file.   Orders:  Orders Placed This Encounter  Procedures   XR  Cervical Spine 2 or 3 views   XR Shoulder Left   Ambulatory referral to Physical Therapy   Meds ordered this encounter  Medications   naproxen (NAPROSYN) 500 MG tablet    Sig: Take 1 tablet (500 mg total) by mouth 2 (two) times daily with a meal.    Dispense:  30 tablet    Refill:  3    Subjective: Chief Complaint  Patient presents with   Neck - Pain   Left Shoulder - Pain    HPI  Review of Systems  Constitutional: Negative.   HENT: Negative.    Eyes: Negative.   Respiratory: Negative.    Cardiovascular: Negative.   Endocrine: Negative.   Musculoskeletal: Negative.   Neurological: Negative.   Hematological: Negative.   Psychiatric/Behavioral: Negative.    All other systems reviewed and are negative.    Objective: Vital Signs: There were no vitals taken for this visit.  Physical Exam Vitals and nursing note reviewed.  Constitutional:      Appearance: She is well-developed.  HENT:     Head: Atraumatic.     Nose: Nose normal.   Eyes:     Extraocular Movements: Extraocular movements intact.    Cardiovascular:     Pulses: Normal pulses.  Pulmonary:     Effort: Pulmonary effort is normal.  Abdominal:     Palpations: Abdomen is soft.   Musculoskeletal:     Cervical back: Neck supple.   Skin:    General: Skin is warm.  Capillary Refill: Capillary refill takes less than 2 seconds.   Neurological:     Mental Status: She is alert. Mental status is at baseline.   Psychiatric:        Behavior: Behavior normal.        Thought Content: Thought content normal.        Judgment: Judgment normal.     Ortho Exam  Specialty Comments:  No specialty comments available.  Imaging: XR Cervical Spine 2 or 3 views Result Date: 04/30/2024 Xrays show no acute abnormalities.  Mild degenerative changes.  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, with long-term current use of insulin  (HCC) 04/24/2023   Chronic renal disease, stage 2, mildly decreased glomerular filtration rate (GFR) between 60-89 mL/min/1.73 square meter 07/25/2022   Encounter for general adult medical examination with abnormal findings 01/23/2022   Paroxysmal SVT (supraventricular tachycardia) (HCC) 01/22/2022   Class 2 severe obesity due to excess calories with serious comorbidity and body mass index (BMI) of 36.0 to 36.9 in adult [facility]) 05/13/2021   Chronic migraine 12/16/2017   Incomplete tear of right rotator cuff 01/26/2015   Past Medical History:  Diagnosis Date   Adhesive capsulitis of right shoulder    ASCUS of cervix with negative high risk HPV    Diabetes mellitus without complication (HCC)    Migraine with aura and with status migrainosus, not intractable    Postmenopausal bleeding    SVT (supraventricular tachycardia) (HCC)     Family History  Problem Relation Age of Onset   Alzheimer's disease Mother    Bladder Cancer Father    Breast cancer Neg Hx    BRCA 1/2 Neg Hx     Past Surgical History:  Procedure Laterality Date   APPENDECTOMY     GALLBLADDER SURGERY  1998   SHOULDER SURGERY  2016   Social History   Occupational History   Occupation: attorney  Tobacco Use   Smoking status: Never   Smokeless tobacco: Never  Vaping Use   Vaping status: Never Used  Substance and Sexual Activity   Alcohol use: Yes    Alcohol/week: 3.0 standard drinks of alcohol    Types: 3 Cans of beer per week   Drug use: Never   Sexual activity: Yes    Partners: Female    Comment: 1st intercourse 58yo

## 2024-05-05 ENCOUNTER — Other Ambulatory Visit: Payer: Self-pay

## 2024-05-10 NOTE — Telephone Encounter (Signed)
 Pharmacy Patient Advocate Encounter  Received notification from [employer] that Prior Authorization for Ozempic  has been APPROVED from 04/12/24 to 04/12/25   PA #/Case ID/Reference #: [id number]

## 2024-05-12 ENCOUNTER — Other Ambulatory Visit (HOSPITAL_COMMUNITY): Payer: Self-pay

## 2024-05-12 MED ORDER — METOPROLOL SUCCINATE ER 50 MG PO TB24
50.0000 mg | ORAL_TABLET | Freq: Every day | ORAL | 3 refills | Status: DC
  Filled 2024-05-12: qty 90, 90d supply, fill #0
  Filled 2024-06-11: qty 30, 30d supply, fill #0
  Filled 2024-07-12: qty 30, 30d supply, fill #1

## 2024-05-12 NOTE — Telephone Encounter (Signed)
Routing to correct triage pool

## 2024-05-13 ENCOUNTER — Ambulatory Visit (INDEPENDENT_AMBULATORY_CARE_PROVIDER_SITE_OTHER): Payer: 59 | Admitting: Internal Medicine

## 2024-05-13 ENCOUNTER — Encounter: Payer: Self-pay | Admitting: Internal Medicine

## 2024-05-13 ENCOUNTER — Other Ambulatory Visit (HOSPITAL_COMMUNITY): Payer: Self-pay

## 2024-05-13 VITALS — BP 120/72 | HR 73 | Ht 67.0 in | Wt 208.0 lb

## 2024-05-13 DIAGNOSIS — E113399 Type 2 diabetes mellitus with moderate nonproliferative diabetic retinopathy without macular edema, unspecified eye: Secondary | ICD-10-CM

## 2024-05-13 DIAGNOSIS — Z794 Long term (current) use of insulin: Secondary | ICD-10-CM

## 2024-05-13 LAB — POCT GLYCOSYLATED HEMOGLOBIN (HGB A1C): Hemoglobin A1C: 6.1 % — AB (ref 4.0–5.6)

## 2024-05-13 MED ORDER — SEMAGLUTIDE (2 MG/DOSE) 8 MG/3ML ~~LOC~~ SOPN
2.0000 mg | PEN_INJECTOR | SUBCUTANEOUS | 3 refills | Status: DC
  Filled 2024-05-15: qty 3, 28d supply, fill #0
  Filled 2024-06-11 – 2024-06-16 (×3): qty 3, 28d supply, fill #1
  Filled 2024-07-09: qty 3, 28d supply, fill #2
  Filled 2024-08-03: qty 3, 28d supply, fill #3
  Filled 2024-08-27: qty 3, 28d supply, fill #4
  Filled 2024-09-26: qty 3, 28d supply, fill #5
  Filled 2024-10-23: qty 3, 28d supply, fill #6

## 2024-05-13 NOTE — Progress Notes (Signed)
 Name: Shelly Whitehead  MRN/ DOB: [id number], [birth year]   Age/ Sex: 58 y.o., female    PCP: Joshua Debby CROME, MD   Reason for Endocrinology Evaluation: Type 2 Diabetes Mellitus     Date of Initial Endocrinology Visit: 12/03/2021    PATIENT IDENTIFIER: Shelly Whitehead is a 58 y.o. female with a past medical history of T2DM, dyslipidemia, Hx of PCOS and migraine headaches. The patient presented for initial endocrinology clinic visit on 12/03/2021 for consultative assistance with her diabetes management.    HPI: [name] was    Diagnosed with DM 2015 Prior Medications tried/Intolerance: Metformin- diarrhea . Bydureon - renal function was low ? But no side effects . Trulicity -nausea                 Hemoglobin A1c has ranged from 6.6% in 2019, peaking at 7.5% in 2021.     On her initial visit to our clinic she had an A1c of 7.8%, she was on Levemir  and Jardiance  but she was having monthly yeast infection requiring monthly Diflucan  prescriptions.  We stopped Jardiance  and started Ozempic    We discontinued Lantus  and continued Ozempic  12/2023 with an A1c of 5.7%   Losartan  was discontinued by PCP due to hypotension 04/2024  SUBJECTIVE:   During the last visit (01/08/2024): A1c 5.7%      Today (05/13/24): Shelly Whitehead is here for follow-up on diabetes management.  She checks her blood sugars multiple times daily, through Dexcom . The patient has had hypoglycemic episodes since the last clinic visit.  Denies nausea, vomiting  Continues  occasional  constipation with  diarrhea  No LE edema   Has bird mites on her feet and would like to postpone for exam today  HOME DIABETES REGIMEN: Ozempic  2 mg weekly        Statin: yes ACE-I/ARB: yes Prior Diabetic Education: yes    CONTINUOUS GLUCOSE MONITORING RECORD INTERPRETATION    Dates of Recording: 6/20-05/13/2024  Sensor description: dexcom  Results statistics:   CGM use % of time 91  Average and SD 127/24  Time in  range 98 %  % Time Above 180 2  % Time above 250 0  % Time Below target 0      Glycemic patterns summary: BGs are optimal throughout the day and night Hyperglycemic episodes: Rare postprandial  Hypoglycemic episodes occurred rarely during the day  Overnight periods: Optimal     DIABETIC COMPLICATIONS: Microvascular complications:   Denies: CKD, retinopathy, neuropathy  Last eye exam: Completed 10/15/2023  Macrovascular complications:   Denies: CAD, PVD, CVA   PAST HISTORY: Past Medical History:  Past Medical History:  Diagnosis Date   Adhesive capsulitis of right shoulder    ASCUS of cervix with negative high risk HPV    Diabetes mellitus without complication (HCC)    Migraine with aura and with status migrainosus, not intractable    Postmenopausal bleeding    SVT (supraventricular tachycardia) (HCC)    Past Surgical History:  Past Surgical History:  Procedure Laterality Date   APPENDECTOMY     GALLBLADDER SURGERY  1998   SHOULDER SURGERY  2016    Social History:  reports that she has never smoked. She has never used smokeless tobacco. She reports current alcohol use of about 3.0 standard drinks of alcohol per week. She reports that she does not use drugs. Family History:  Family History  Problem Relation Age of Onset   Alzheimer's disease Mother    Bladder Cancer Father  Breast cancer Neg Hx    BRCA 1/2 Neg Hx      HOME MEDICATIONS: Allergies as of 05/13/2024       Reactions   Jardiance  [empagliflozin ] Other (See Comments)   Yeast infection   Metformin Diarrhea, Nausea Only   Oxycodone Itching   Oxycodone-acetaminophen Itching        Medication List        Accurate as of May 13, 2024  8:27 AM. If you have any questions, ask your nurse or doctor.          STOP taking these medications    cephALEXin  500 MG capsule Commonly known as: KEFLEX  Stopped by: Andrianna Manalang J Seward Coran   doxycycline  100 MG capsule Commonly known as:  VIBRAMYCIN  Stopped by: Donell PARAS Lenay Lovejoy   losartan  25 MG tablet Commonly known as: COZAAR  Stopped by: Kru Allman J Verlie Liotta       TAKE these medications    atorvastatin  20 MG tablet Commonly known as: LIPITOR Take 1 tablet (20 mg total) by mouth daily.   Dexcom G7 Sensor Misc Apply 1 sensor every 10 days.   metoprolol  succinate 50 MG 24 hr tablet Commonly known as: TOPROL -XL Take 1 tablet (50 mg total) by mouth daily. Take with or immediately following a meal.   naproxen  500 MG tablet Commonly known as: NAPROSYN  Take 1 tablet (500 mg total) by mouth 2 (two) times daily with a meal.   Ozempic  (2 MG/DOSE) 8 MG/3ML Sopn Generic drug: Semaglutide  (2 MG/DOSE) Inject 2 mg as directed once a week.         ALLERGIES: Allergies  Allergen Reactions   Jardiance  [Empagliflozin ] Other (See Comments)    Yeast infection   Metformin Diarrhea and Nausea Only   Oxycodone Itching   Oxycodone-Acetaminophen Itching     REVIEW OF SYSTEMS: A comprehensive ROS was conducted with the patient and is negative except as per HPI    OBJECTIVE:   VITAL SIGNS: BP 120/72 (BP Location: Left Arm, Patient Position: Sitting, Cuff Size: Normal)   Pulse 73   Ht 5' 7 (1.702 m)   Wt 208 lb (94.3 kg)   SpO2 98%   BMI 32.58 kg/m    PHYSICAL EXAM:  General: Pt appears well and is in NAD  Lungs: Clear with good BS bilat   Heart: RRR  Abdomen: Soft, nontender  Extremities:  Lower extremities - No pretibial edema.   Neuro: MS is good with appropriate affect, pt is alert and Ox3    DM foot exam: 04/24/2023  The skin of the feet is without sores or ulcerations. The pedal pulses are 2+ on right and 2+ on left. The sensation is intact to a screening 5.07, 10 gram monofilament bilaterally   DATA REVIEWED:  Lab Results  Component Value Date   HGBA1C 6.1 (A) 05/13/2024   HGBA1C 5.7 (A) 01/08/2024   HGBA1C 6.0 (A) 04/24/2023    Latest Reference Range & Units 02/19/24 11:06  Sodium  135 - 145 mEq/L 137  Potassium 3.5 - 5.1 mEq/L 4.5  Chloride 96 - 112 mEq/L 102  CO2 19 - 32 mEq/L 29  Glucose 70 - 99 mg/dL 847 (H)  BUN 6 - 23 mg/dL 15  Creatinine 9.59 - 8.79 mg/dL 8.88  Calcium  8.4 - 10.5 mg/dL 9.3  Alkaline Phosphatase 39 - 117 U/L 104  Albumin 3.5 - 5.2 g/dL 4.4  AST 0 - 37 U/L 23  ALT 0 - 35 U/L 25  Total Protein 6.0 - 8.3 g/dL  7.7  Bilirubin, Direct 0.0 - 0.3 mg/dL 0.1  Total Bilirubin 0.2 - 1.2 mg/dL 0.6  GFR >39.99 mL/min 55.02 (L)  Total CHOL/HDL Ratio  2  Cholesterol 0 - 200 mg/dL 882  HDL Cholesterol >60.99 mg/dL 51.99  LDL (calc) 0 - 99 mg/dL 47  MICROALB/CREAT RATIO 0.0 - 30.0 mg/g Unable to calculate  NonHDL  68.95  Triglycerides 0.0 - 149.0 mg/dL 891.9  VLDL 0.0 - 59.9 mg/dL 78.3  (H): Data is abnormally high (L): Data is abnormally low  Old records , labs and images have been reviewed.    ASSESSMENT / PLAN / RECOMMENDATIONS:   1) Type 2 Diabetes Mellitus, Optimally controlled, With diabetic retinopathy complications - Most recent A1c of 5.7 %. Goal A1c < 7.0 %.     -A1c remains optimal  - No changes - Will postpone foot exam due to the presence of bird mites on her feet    MEDICATIONS:  - Continue Ozempic  2 mg weekly     EDUCATION / INSTRUCTIONS: BG monitoring instructions: Patient is instructed to check her blood sugars 3 times a day, before meals . Call [facility] if: BG persistently < 70  I reviewed the Rule of 15 for the treatment of hypoglycemia in detail with the patient. Literature supplied.   2) Diabetic complications:  Eye: Does not have known diabetic retinopathy.  Neuro/ Feet: Does not have known diabetic peripheral neuropathy. Renal: Patient does not have known baseline CKD. She is  on an ACEI/ARB at present.       Follow-up in 6 months       Signed electronically by: Stefano Redgie Butts, MD  [facility]  [facility] [address] [place] [place], [place] [address] Phone: [phone] FAX: [phone]   CC: Joshua Debby CROME, MD [address] [place] [address] Phone: [phone]  Fax: [phone]    Return to Endocrinology clinic as below: Future Appointments  Date Time Provider Department Center  05/20/2024  8:40 AM Joshua Debby CROME, MD [facility] None  05/20/2024 11:45 AM Nonato, Gellen April Ma L, PT [facility] None  07/23/2024  8:00 AM Lelon Hamilton T, PA-C [facility]  08/31/2024  8:40 AM Joshua Debby CROME, MD [facility] None

## 2024-05-15 ENCOUNTER — Other Ambulatory Visit (HOSPITAL_COMMUNITY): Payer: Self-pay

## 2024-05-20 ENCOUNTER — Other Ambulatory Visit: Payer: Self-pay

## 2024-05-20 ENCOUNTER — Other Ambulatory Visit (HOSPITAL_COMMUNITY): Payer: Self-pay

## 2024-05-20 ENCOUNTER — Encounter: Payer: Self-pay | Admitting: Internal Medicine

## 2024-05-20 ENCOUNTER — Ambulatory Visit: Admitting: Physical Therapy

## 2024-05-20 ENCOUNTER — Ambulatory Visit: Admitting: Internal Medicine

## 2024-05-20 ENCOUNTER — Encounter: Payer: Self-pay | Admitting: Physical Therapy

## 2024-05-20 VITALS — BP 124/80 | HR 71 | Temp 98.0°F | Ht 67.0 in | Wt 204.4 lb

## 2024-05-20 DIAGNOSIS — B359 Dermatophytosis, unspecified: Secondary | ICD-10-CM | POA: Diagnosis not present

## 2024-05-20 DIAGNOSIS — M6281 Muscle weakness (generalized): Secondary | ICD-10-CM

## 2024-05-20 DIAGNOSIS — E113393 Type 2 diabetes mellitus with moderate nonproliferative diabetic retinopathy without macular edema, bilateral: Secondary | ICD-10-CM

## 2024-05-20 DIAGNOSIS — Z794 Long term (current) use of insulin: Secondary | ICD-10-CM | POA: Diagnosis not present

## 2024-05-20 DIAGNOSIS — R293 Abnormal posture: Secondary | ICD-10-CM | POA: Diagnosis not present

## 2024-05-20 DIAGNOSIS — I471 Supraventricular tachycardia, unspecified: Secondary | ICD-10-CM | POA: Diagnosis not present

## 2024-05-20 DIAGNOSIS — M25512 Pain in left shoulder: Secondary | ICD-10-CM | POA: Diagnosis not present

## 2024-05-20 DIAGNOSIS — R278 Other lack of coordination: Secondary | ICD-10-CM | POA: Diagnosis not present

## 2024-05-20 MED ORDER — TERBINAFINE HCL 250 MG PO TABS
250.0000 mg | ORAL_TABLET | Freq: Every day | ORAL | 0 refills | Status: AC
  Filled 2024-05-20: qty 14, 14d supply, fill #0

## 2024-05-20 NOTE — Therapy (Addendum)
 OUTPATIENT PHYSICAL THERAPY SHOULDER EVALUATION   Patient Name: Shelly Whitehead MRN: [id number] DOB:07-14-1966, 58 y.o., female Today's Date: 05/20/2024  END OF SESSION:  PT End of Session - 05/20/24 1154     Visit Number 1    Number of Visits 8    Date for PT Re-Evaluation 07/15/24    Authorization Type [employer]    PT Start Time 1150    PT Stop Time 1230    PT Time Calculation (min) 40 min    Activity Tolerance Patient tolerated treatment well          Past Medical History:  Diagnosis Date   Adhesive capsulitis of right shoulder    ASCUS of cervix with negative high risk HPV    Diabetes mellitus without complication (HCC)    Migraine with aura and with status migrainosus, not intractable    Postmenopausal bleeding    SVT (supraventricular tachycardia) (HCC)    Past Surgical History:  Procedure Laterality Date   APPENDECTOMY     GALLBLADDER SURGERY  1998   SHOULDER SURGERY  2016   Patient Active Problem List   Diagnosis Date Noted   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 05/20/2024   Cervical cancer screening 02/20/2024   Melanocytic nevi of trunk 02/19/2024   Dyslipidemia, goal LDL below 70 07/30/2023   Chronic eczematous otitis externa of both ears 07/30/2023   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, with long-term current use of insulin  (HCC) 04/24/2023   Chronic renal disease, stage 2, mildly decreased glomerular filtration rate (GFR) between 60-89 mL/min/1.73 square meter 07/25/2022   Encounter for general adult medical examination with abnormal findings 01/23/2022   Paroxysmal SVT (supraventricular tachycardia) (HCC) 01/22/2022   Class 2 severe obesity due to excess calories with serious comorbidity and body mass index (BMI) of 36.0 to 36.9 in adult (HCC) 05/13/2021   Chronic migraine 12/16/2017    PCP: Joshua Debby CROME, MD  REFERRING PROVIDER: Jerri Kay HERO, MD  REFERRING DIAG: M54.2 (ICD-10-CM) - Neck pain M25.512,G89.29 (ICD-10-CM) -  Chronic left shoulder pain  THERAPY DIAG:  Acute pain of left shoulder  Muscle weakness (generalized)  Abnormal posture  Other lack of coordination  Rationale for Evaluation and Treatment: Rehabilitation  ONSET DATE: 4-5 months  SUBJECTIVE:                                                                                                                                                                                                         SUBJECTIVE STATEMENT: Pt states her shoulder has been bothering her for months.  Has been interfering her sleep. Difficulty with turning of the lamp. Pt reports no known method of injury. Just noticed that her shoulder would wake her up in the middle of the night. States she typically likes to sleep on her stomach with her L arm outstretched but unable to do this now.  Hand dominance: Right  PERTINENT HISTORY:  Per referral: Left shoulder impingement, rotator cuff syndrome R frozen shoulder with rotator cuff repair 2016  PAIN:  Are you having pain? Yes: NPRS scale: at rest 0/10, at worst 6/10 Pain location: L shoulder (top and posterior/lateral) Pain description: can go from ache to a sharp Aggravating factors: Sleeping and reaching Relieving factors: Pain medication  PRECAUTIONS: None  RED FLAGS: None     WEIGHT BEARING RESTRICTIONS: No  FALLS:  Has patient fallen in last 6 months? 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  DIAGNOSTIC FINDINGS:  04/30/24 X-rays of the left shoulder show no acute or structural abnormalities   PATIENT SURVEYS:  PSFS: THE PATIENT SPECIFIC FUNCTIONAL SCALE  Place score of 0-10 (0 = unable to perform activity  and 10 = able to perform activity at the same level as before injury or problem)  Activity Date: 05/20/24    Sleep 8    2. Reaching for things 6    3. Sudden movements 6    4.      AVERAGE Score 6.67      Total Score = Sum of activity scores/number of activities  Minimally Detectable Change: 3 points (for single activity); 2 points (for average score)  [facility] (nd). The Patient Specific Functional Scale . Retrieved from [email or web address]   COGNITION: Overall cognitive status: Within functional limits for tasks assessed  SENSATION: WFL  POSTURE: rounded shoulders  PALPATION: No overt tenderness about UTs, tenderness along L periscapular muscle (rhomboid/mid trap -- pt states this is chronic)   CERVICAL ROM:   Active ROM A/PROM (deg) eval  Flexion 100% can feel pull on L shoulder  Extension 100% can vaguely feel on L shoulder  Right lateral flexion 80%  Left lateral flexion 80%  Right rotation 100%  Left rotation 100%   (Blank rows = not tested)  UPPER EXTREMITY ROM:  Active ROM Right eval Left eval  Shoulder flexion 165 160 but can feel at 120 along lateral shoulder  Shoulder extension 70 70  Shoulder abduction 165 160 but pain at 125 deg on top of shoulder  Shoulder adduction    Shoulder extension    Shoulder internal rotation T5 T8 pain  Shoulder external rotation 70 35 discomfort  Elbow flexion    Elbow extension    Wrist flexion    Wrist extension    Wrist ulnar deviation    Wrist radial deviation    Wrist pronation    Wrist supination     (Blank rows = not tested)  UPPER EXTREMITY MMT:  MMT Right eval Left eval  Shoulder flexion 5 4+  Shoulder extension prone 4 3+  Shoulder abduction 5 3+ pain  Shoulder adduction    Shoulder extension 5 4-  Shoulder internal rotation 5 5  Shoulder external rotation 3+ 3+ pain  Middle trapezius prone 5 3+  Lower trapezius  Unable to test  due to pain  Elbow flexion    Elbow extension    Wrist flexion    Wrist extension    Wrist ulnar deviation    Wrist radial deviation    Wrist pronation    Wrist supination    Grip strength     (Blank rows = not tested)  SHOULDER SPECIAL TESTS: Impingement tests: Painful arc test: positive   SLAP lesions: Biceps load test: negative Instability tests: Apprehension test: positive  Rotator cuff assessment: Full can test: positive  Biceps assessment: Did not assess  FUNCTIONAL TESTS:  Did not assess  TREATMENT DATE: 05/20/24 See HEP below. Trial of 10 reps performed in clinic                  PATIENT EDUCATION:  Education details: Exam findings, POC, initial HEP Person educated: Patient Education method: Explanation, Demonstration, and Handouts Education comprehension: verbalized understanding, returned demonstration, and needs further education  HOME EXERCISE PROGRAM: Access Code: [email or web address] URL: [email or web address] Date: 05/20/2024 Prepared by: Rheagan Nayak April Earnie Starring  Exercises - Standing Bilateral Low Shoulder Row with Anchored Resistance  - 1 x daily - 7 x weekly - 2 sets - 10 reps - Shoulder extension with resistance - Neutral  - 1 x daily - 7 x weekly - 2 sets - 10 reps - Shoulder External Rotation Reactive Isometrics  - 1 x daily - 7 x weekly - 2 sets - 10 reps - 3 se hold - Shoulder Internal Rotation Reactive Isometrics  - 1 x daily - 7 x weekly - 2 sets - 10 reps - 3 sec hold  ASSESSMENT:  CLINICAL IMPRESSION: Patient is a 58 y.o. F who was seen today for physical therapy evaluation and treatment for L shoulder pain. Neck pain has decreased since seeing ortho. PMH significant for history of R frozen shoulder and rotator cuff repair. Assessment demos painful but full L shoulder AROM with scapular dyskinesia and weak posterior shoulder/scapular stabilizers. Pt will benefit from PT to improve on these deficits to return to her normal level of function.    OBJECTIVE IMPAIRMENTS: decreased activity tolerance, decreased coordination, decreased mobility, decreased ROM, decreased strength, impaired UE functional use, improper body mechanics, postural dysfunction, and pain.   ACTIVITY LIMITATIONS: carrying, lifting, sleeping, reach over head, and hygiene/grooming  PARTICIPATION LIMITATIONS: meal prep, cleaning, laundry, shopping, and community activity  PERSONAL FACTORS: Age, Fitness, Past/current experiences, and Time since onset of injury/illness/exacerbation are also affecting patient's functional outcome.   REHAB POTENTIAL: Good  CLINICAL DECISION MAKING: Evolving/moderate complexity  EVALUATION COMPLEXITY: Moderate   GOALS: Goals reviewed with patient? Yes  SHORT TERM GOALS: Target date: 06/17/2024   Pt will be ind with initial HEP Baseline:  Goal status: INITIAL  2.  Pt will have full and pain free shoulder AROM Baseline: See ROM chart above Goal status: INITIAL   LONG TERM GOALS: Target date: 07/15/2024   Pt will be ind with management and progression of HEP Baseline:  Goal status: INITIAL  2.  Pt will demo at least 4/5 shoulder strength for overhead reaching and lifting tasks Baseline: See MMT above Goal status: INITIAL  3.  Pt will have improved PSFS to >/=8.67 Baseline: 6.67 Goal status: INITIAL  4.  Pt will report improved overall pain by >/=75% Baseline: worst pain 6 Goal status: INITIAL    PLAN:  PT FREQUENCY: 1x/week  PT DURATION: 8 weeks  PLANNED INTERVENTIONS: 97164- PT Re-evaluation, 97750- Physical Performance Testing, 97110-Therapeutic exercises, 97530- Therapeutic  activity, [id number]- Neuromuscular re-education, [phone]- Self Care, 02859- Manual therapy, G0283- Electrical stimulation (unattended), [id number]- Ultrasound, [id number]- Ionotophoresis 4mg /ml Dexamethasone, 20560 (1-2 muscles), 20561 (3+ muscles)- Dry Needling, Patient/Family education, Taping, Joint mobilization, Cryotherapy, and Moist heat  PLAN  FOR NEXT SESSION: Assess response to HEP. Work on posterior shoulder/scapular strengthening/control and gross rotator cuff stability.    Jaszmine Navejas April Ma L Conner Neiss, PT, DPT 05/20/2024, 4:17 PM

## 2024-05-20 NOTE — Patient Instructions (Signed)
 Athlete's Foot Athlete's foot (tinea pedis) is a fungal infection of the skin on your feet. It often occurs on the skin that is between or underneath the toes. It can also occur on the soles of your feet. The infection can spread from person to person (is contagious). It can also spread when a person's bare feet come in contact with the fungus on shower floors or on items such as shoes. What are the causes? This condition is caused by a fungus that grows in warm, moist places. You can get athlete's foot by sharing shoes, shower stalls, towels, and wet floors with someone who is infected. Not washing your feet or changing your socks often enough can also lead to athlete's foot. What increases the risk? This condition is more likely to develop in: Men. People who have a weak body defense system (immune system). People who have diabetes. People who use public showers, such as at a gym. People who wear heavy-duty shoes, such as Youth worker. Seasons with warm, humid weather. What are the signs or symptoms? Symptoms of this condition include: Itchy areas between your toes or on the soles of your feet. White, flaky, or scaly areas between your toes or on the soles of your feet. Very itchy small blisters between your toes or on the soles of your feet. Small cuts in your skin. These cuts can become infected. Thick or discolored toenails. How is this diagnosed? This condition may be diagnosed with a physical exam and a review of your medical history. Your health care provider may also take a skin or toenail sample to examine under a microscope. How is this treated? This condition is treated with antifungal medicines. These may be applied as powders, ointments, or creams. In severe cases, an oral antifungal medicine may be given. Follow these instructions at home: Medicines Apply or take over-the-counter and prescription medicines only as told by your health care provider. Apply your  antifungal medicine as told by your health care provider. Do not stop using the antifungal even if your condition improves. Foot care Do not scratch your feet. Keep your feet dry: Wear cotton or wool socks. Change your socks every day or if they become wet. Wear shoes that allow air to flow, such as sandals or canvas tennis shoes. Wash and dry your feet, including the area between your toes. Also, wash and dry your feet: Every day or as told by your health care provider. After exercising. General instructions Do not let others use towels, shoes, nail clippers, or other personal items that touch your feet. Protect your feet by wearing sandals in wet areas, such as locker rooms and shared showers. Keep all follow-up visits. This is important. If you have diabetes, keep your blood sugar under control. Contact a health care provider if: You have a fever. You have swelling, soreness, warmth, or redness in your foot. Your feet are not getting better with treatment. Your symptoms get worse. You have new symptoms. You have severe pain. Summary Athlete's foot (tinea pedis) is a fungal infection of the skin on your feet. It often occurs on skin that is between or underneath the toes. This condition is caused by a fungus that grows in warm, moist places. Symptoms include white, flaky, or scaly areas between your toes or on the soles of your feet. This condition is treated with antifungal medicines. Keep your feet clean. Always dry them thoroughly. This information is not intended to replace advice given to you by  your health care provider. Make sure you discuss any questions you have with your health care provider. Document Revised: 02/18/2021 Document Reviewed: 02/18/2021 Elsevier Patient Education  2024 [employer].

## 2024-05-20 NOTE — Progress Notes (Signed)
 Subjective:  Patient ID: Shelly Whitehead, female    DOB: [birth year]  Age: 58 y.o. MRN: [id number]  CC: Insect Bite (The insect bites was 6 weeks ago. Patient thinks they are infected. Uncomfortable when patient walks. )   HPI Shelly Whitehead presents for f/up ----  Discussed the use of AI scribe software for clinical note transcription with the patient, who gave verbal consent to proceed.  History of Present Illness   Shelly Whitehead is a 58 year old female who presents with a persistent skin condition following a bird mite infestation.  Approximately six weeks ago, she experienced a bird mite infestation resulting in numerous bites all over her body, with a concentration on her right foot. She describes having 'like a hundred of them.' Although the infestation has resolved, she continues to experience issues with her skin, particularly on her right foot, which is turning red. She has not used any topical antifungal treatments such as Tenactin or Lamisil .  She has been treated with doxycycline  and Keflex , and has used Epsom salts. Despite these treatments, the condition, initially thought to be tinea, has persisted. She has visited urgent care twice for this issue.  She mentions having had a diagnostic mammogram due to the bites, which caused significant welts and sensitivity. The mammogram area of focus was where the bites had been, but the issue resolved by the time of the mammogram.  Regarding her cardiac history, she mentions discontinuing metoprolol  temporarily but resumed it due to discomfort from feeling her heart beating, although she did not experience palpitations.       Outpatient Medications Prior to Visit  Medication Sig Dispense Refill   atorvastatin  (LIPITOR) 20 MG tablet Take 1 tablet (20 mg total) by mouth daily. 90 tablet 1   Continuous Glucose Sensor (DEXCOM G7 SENSOR) MISC Apply 1 sensor every 10 days. 9 each 3   metoprolol  succinate (TOPROL -XL) 50 MG 24 hr tablet Take 1  tablet (50 mg total) by mouth daily. Take with or immediately following a meal. 90 tablet 3   naproxen  (NAPROSYN ) 500 MG tablet Take 1 tablet (500 mg total) by mouth 2 (two) times daily with a meal. 30 tablet 3   Semaglutide , 2 MG/DOSE, 8 MG/3ML SOPN Inject 2 mg as directed once a week. 9 mL 3   No facility-administered medications prior to visit.    ROS Review of Systems  Constitutional: Negative.  Negative for chills, diaphoresis and fatigue.  HENT: Negative.    Eyes: Negative.   Respiratory:  Negative for shortness of breath.   Cardiovascular:  Negative for chest pain, palpitations and leg swelling.  Gastrointestinal:  Negative for abdominal pain, diarrhea and nausea.  Endocrine: Negative.   Genitourinary: Negative.  Negative for difficulty urinating.  Musculoskeletal:  Positive for arthralgias. Negative for myalgias.  Skin:  Positive for color change and rash.  Neurological:  Negative for dizziness.  Hematological:  Negative for adenopathy. Does not bruise/bleed easily.  Psychiatric/Behavioral: Negative.      Objective:  BP 124/80 (BP Location: Left Arm, Patient Position: Sitting, Cuff Size: Normal)   Pulse 71   Temp 98 F (36.7 C) (Oral)   Ht 5' 7 (1.702 m)   Wt 204 lb 6.4 oz (92.7 kg)   SpO2 97%   BMI 32.01 kg/m   BP Readings from Last 3 Encounters:  05/20/24 124/80  05/13/24 120/72  04/29/24 118/79    Wt Readings from Last 3 Encounters:  05/20/24 204 lb 6.4 oz (92.7 kg)  05/13/24 208  lb (94.3 kg)  02/19/24 212 lb 9.6 oz (96.4 kg)    Physical Exam Vitals reviewed.  HENT:     Mouth/Throat:     Mouth: Mucous membranes are moist.  Eyes:     General: No scleral icterus.    Conjunctiva/sclera: Conjunctivae normal.  Cardiovascular:     Rate and Rhythm: Normal rate and regular rhythm.     Heart sounds: No murmur heard.    No friction rub. No gallop.  Pulmonary:     Effort: Pulmonary effort is normal.     Breath sounds: No stridor. No wheezing, rhonchi or  rales.  Abdominal:     Palpations: There is no mass.     Tenderness: There is no abdominal tenderness. There is no guarding.     Hernia: No hernia is present.  Musculoskeletal:        General: Normal range of motion.     Cervical back: Neck supple.     Right lower leg: No edema.  Lymphadenopathy:     Cervical: No cervical adenopathy.  Skin:    General: Skin is warm.     Findings: Rash present.  Neurological:     General: No focal deficit present.     Mental Status: She is alert.     Lab Results  Component Value Date   WBC 4.7 02/19/2024   HGB 15.0 02/19/2024   HCT 44.5 02/19/2024   PLT 253.0 02/19/2024   GLUCOSE 152 (H) 02/19/2024   CHOL 117 02/19/2024   TRIG 108.0 02/19/2024   HDL 48.00 02/19/2024   LDLCALC 47 02/19/2024   ALT 25 02/19/2024   AST 23 02/19/2024   NA 137 02/19/2024   K 4.5 02/19/2024   CL 102 02/19/2024   CREATININE 1.11 02/19/2024   BUN 15 02/19/2024   CO2 29 02/19/2024   TSH 2.08 02/19/2024   HGBA1C 6.1 (A) 05/13/2024   MICROALBUR <0.7 02/19/2024    XR Cervical Spine 2 or 3 views Result Date: 04/30/2024 Xrays show no acute abnormalities.  Mild degenerative changes.  XR Shoulder Left Result Date: 04/30/2024 X-rays of the left shoulder show no acute or structural abnormalities    Assessment & Plan:   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 -     Terbinafine  HCl; Take 1 tablet (250 mg total) by mouth daily for 14 days.  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     Follow-up: Return in about 3 months (around 08/20/2024).  Debby Molt, MD

## 2024-05-26 NOTE — Therapy (Signed)
 OUTPATIENT PHYSICAL THERAPY SHOULDER TREATMENT   Patient Name: Shelly Whitehead MRN: [id number] DOB:[birth year], 58 y.o., female Today's Date: 05/27/2024  END OF SESSION:  PT End of Session - 05/27/24 1405     Visit Number 2    Number of Visits 8    Date for PT Re-Evaluation 07/15/24    Authorization Type [employer]    PT Start Time 1152    PT Stop Time 1235    PT Time Calculation (min) 43 min    Activity Tolerance Patient tolerated treatment well    Behavior During Therapy WFL for tasks assessed/performed           Past Medical History:  Diagnosis Date   Adhesive capsulitis of right shoulder    ASCUS of cervix with negative high risk HPV    Diabetes mellitus without complication (HCC)    Migraine with aura and with status migrainosus, not intractable    Postmenopausal bleeding    SVT (supraventricular tachycardia) (HCC)    Past Surgical History:  Procedure Laterality Date   APPENDECTOMY     GALLBLADDER SURGERY  1998   SHOULDER SURGERY  2016   Patient Active Problem List   Diagnosis Date Noted   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 05/20/2024   Cervical cancer screening 02/20/2024   Melanocytic nevi of trunk 02/19/2024   Dyslipidemia, goal LDL below 70 07/30/2023   Chronic eczematous otitis externa of both ears 07/30/2023   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, with long-term current use of insulin  (HCC) 04/24/2023   Chronic renal disease, stage 2, mildly decreased glomerular filtration rate (GFR) between 60-89 mL/min/1.73 square meter 07/25/2022   Encounter for general adult medical examination with abnormal findings 01/23/2022   Paroxysmal SVT (supraventricular tachycardia) (HCC) 01/22/2022   Class 2 severe obesity due to excess calories with serious comorbidity and body mass index (BMI) of 36.0 to 36.9 in adult (HCC) 05/13/2021   Chronic migraine 12/16/2017    PCP: Joshua Debby CROME, MD  REFERRING PROVIDER: Jerri Kay HERO, MD  REFERRING DIAG:  M54.2 (ICD-10-CM) - Neck pain M25.512,G89.29 (ICD-10-CM) - Chronic left shoulder pain  THERAPY DIAG:  Acute pain of left shoulder  Muscle weakness (generalized)  Abnormal posture  Other lack of coordination  Rationale for Evaluation and Treatment: Rehabilitation  ONSET DATE: 4-5 months  SUBJECTIVE:                                                                                                                                                                                                         SUBJECTIVE  STATEMENT: Pt reports sleeping is still painful in the left shoulder.  Has been compliant with HEP until T band broke.     Eval: Pt states her shoulder has been bothering her for months. Has been interfering her sleep. Difficulty with turning of the lamp. Pt reports no known method of injury. Just noticed that her shoulder would wake her up in the middle of the night. States she typically likes to sleep on her stomach with her L arm outstretched but unable to do this now.  Hand dominance: Right  PERTINENT HISTORY:  Per referral: Left shoulder impingement, rotator cuff syndrome R frozen shoulder with rotator cuff repair 2016  PAIN:  Are you having pain? Yes: NPRS scale: at rest 0/10, sleeping 5/10, at worst 6/10 Pain location: L shoulder (top and posterior/lateral) Pain description: can go from ache to a sharp Aggravating factors: Sleeping and reaching Relieving factors: Pain medication  PRECAUTIONS: None  RED FLAGS: None     WEIGHT BEARING RESTRICTIONS: No  FALLS:  Has patient fallen in last 6 months? 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  DIAGNOSTIC FINDINGS:   04/30/24 X-rays of the left shoulder show no acute or structural abnormalities   PATIENT SURVEYS:  PSFS: THE PATIENT SPECIFIC FUNCTIONAL SCALE  Place score of 0-10 (0 = unable to perform activity and 10 = able to perform activity at the same level as before injury or problem)  Activity Date: 05/20/24    Sleep 8    2. Reaching for things 6    3. Sudden movements 6    4.      AVERAGE Score 6.67      Total Score = Sum of activity scores/number of activities  Minimally Detectable Change: 3 points (for single activity); 2 points (for average score)  [facility] (nd). The Patient Specific Functional Scale . Retrieved from [email or web address]   COGNITION: Overall cognitive status: Within functional limits for tasks assessed  SENSATION: WFL  POSTURE: rounded shoulders  PALPATION: No overt tenderness about UTs, tenderness along L periscapular muscle (rhomboid/mid trap -- pt states this is chronic)   CERVICAL ROM:   Active ROM A/PROM (deg) eval  Flexion 100% can feel pull on L shoulder  Extension 100% can vaguely feel on L shoulder  Right lateral flexion 80%  Left lateral flexion 80%  Right rotation 100%  Left rotation 100%   (Blank rows = not tested)  UPPER EXTREMITY ROM:  Active ROM Right eval Left eval  Shoulder flexion 165 160 but can feel at 120 along lateral shoulder  Shoulder extension 70 70  Shoulder abduction 165 160 but pain at 125 deg on top of shoulder  Shoulder adduction    Shoulder extension    Shoulder internal rotation T5 T8 pain  Shoulder external rotation 70 35 discomfort  Elbow flexion    Elbow extension    Wrist flexion    Wrist extension    Wrist ulnar deviation    Wrist radial deviation    Wrist pronation    Wrist supination     (Blank rows = not tested)  UPPER EXTREMITY MMT:  MMT Right eval Left eval  Shoulder flexion 5 4+  Shoulder extension prone 4 3+  Shoulder  abduction 5 3+ pain  Shoulder adduction    Shoulder extension 5 4-  Shoulder internal rotation 5 5  Shoulder external rotation 3+ 3+ pain  Middle trapezius prone 5 3+  Lower trapezius  Unable to test due to pain  Elbow flexion    Elbow extension    Wrist flexion    Wrist extension    Wrist ulnar deviation    Wrist radial deviation    Wrist pronation    Wrist supination    Grip strength     (Blank rows = not tested)  SHOULDER SPECIAL TESTS: Impingement tests: Painful arc test: positive   SLAP lesions: Biceps load test: negative Instability tests: Apprehension test: positive  Rotator cuff assessment: Full can test: positive  Biceps assessment: Did not assess  FUNCTIONAL TESTS:  Did not assess  TREATMENT DATE:  05/27/24 UBE 2.5 min each way Tband rows 3x10 blue Tband ext 3x10 Blue Tband IR 3x10 blue Scaption with DB 2# 3x10  Ball against wall 15x CW/CCW  Manual: PROM, scap mobs, TFM at supra insertion     05/20/24 See HEP below. Trial of 10 reps performed in clinic                  PATIENT EDUCATION:  Education details: Exam findings, POC, initial HEP Person educated: Patient Education method: Explanation, Demonstration, and Handouts Education comprehension: verbalized understanding, returned demonstration, and needs further education  HOME EXERCISE PROGRAM: Access Code: [email or web address] URL: [email or web address] Date: 05/20/2024 Prepared by: Gellen April Earnie Starring  Exercises - Standing Bilateral Low Shoulder Row with Anchored Resistance  - 1 x daily - 7 x weekly - 2 sets - 10 reps - Shoulder extension with resistance - Neutral  - 1 x daily - 7 x weekly - 2 sets - 10 reps - Shoulder External Rotation Reactive Isometrics  - 1 x daily - 7 x weekly - 2 sets - 10 reps - 3 se hold - Shoulder Internal Rotation Reactive Isometrics  - 1 x daily - 7 x weekly - 2 sets - 10 reps - 3 sec hold  ASSESSMENT:  CLINICAL IMPRESSION: Pt needed VC for proper  scapular position with ball exercises. Demonstrated understanding. Had pain with ER motion, decreased with manual distraction.   OBJECTIVE IMPAIRMENTS: decreased activity tolerance, decreased coordination, decreased mobility, decreased ROM, decreased strength, impaired UE functional use, improper body mechanics, postural dysfunction, and pain.   ACTIVITY LIMITATIONS: carrying, lifting, sleeping, reach over head, and hygiene/grooming  PARTICIPATION LIMITATIONS: meal prep, cleaning, laundry, shopping, and community activity  PERSONAL FACTORS: Age, Fitness, Past/current experiences, and Time since onset of injury/illness/exacerbation are also affecting patient's functional outcome.   REHAB POTENTIAL: Good  CLINICAL DECISION MAKING: Evolving/moderate complexity  EVALUATION COMPLEXITY: Moderate   GOALS: Goals reviewed with patient? Yes  SHORT TERM GOALS: Target date: 06/17/2024   Pt will be ind with initial HEP Baseline:  Goal status: INITIAL  2.  Pt will have full and pain free shoulder AROM Baseline: See ROM chart above Goal status: INITIAL   LONG TERM GOALS: Target date: 07/15/2024   Pt will be ind with management and progression of HEP Baseline:  Goal status: INITIAL  2.  Pt will demo at least 4/5 shoulder strength for overhead reaching and lifting tasks Baseline: See MMT above Goal status: INITIAL  3.  Pt will have improved PSFS to >/=8.67 Baseline: 6.67 Goal status: INITIAL  4.  Pt will report improved overall pain by >/=75% Baseline: worst pain 6 Goal  status: INITIAL    PLAN:  PT FREQUENCY: 1x/week  PT DURATION: 8 weeks  PLANNED INTERVENTIONS: 97164- PT Re-evaluation, 97750- Physical Performance Testing, 97110-Therapeutic exercises, 97530- Therapeutic activity, [id number]- Neuromuscular re-education, 97535- Self Care, 02859- Manual therapy, G0283- Electrical stimulation (unattended), [id number]- Ultrasound, 02966- Ionotophoresis 4mg /ml Dexamethasone, 79439 (1-2  muscles), 20561 (3+ muscles)- Dry Needling, Patient/Family education, Taping, Joint mobilization, Cryotherapy, and Moist heat  PLAN FOR NEXT SESSION: Continue with focus on RC strengthening.   Burnard CHRISTELLA Meth, PT 05/27/2024, 2:07 PM

## 2024-05-27 ENCOUNTER — Ambulatory Visit (INDEPENDENT_AMBULATORY_CARE_PROVIDER_SITE_OTHER)

## 2024-05-27 DIAGNOSIS — M25512 Pain in left shoulder: Secondary | ICD-10-CM

## 2024-05-27 DIAGNOSIS — R293 Abnormal posture: Secondary | ICD-10-CM

## 2024-05-27 DIAGNOSIS — M6281 Muscle weakness (generalized): Secondary | ICD-10-CM

## 2024-05-27 DIAGNOSIS — R278 Other lack of coordination: Secondary | ICD-10-CM

## 2024-06-02 NOTE — Therapy (Signed)
 OUTPATIENT PHYSICAL THERAPY SHOULDER TREATMENT   Patient Name: Shelly Whitehead MRN: [id number] DOB:10-Jul-1966, 58 y.o., female Today's Date: 06/03/2024  END OF SESSION:  PT End of Session - 06/03/24 1148     Visit Number 3    Number of Visits 8    Date for PT Re-Evaluation 07/15/24    Authorization Type UHC    PT Start Time 1057    PT Stop Time 1135    PT Time Calculation (min) 38 min    Activity Tolerance Patient tolerated treatment well    Behavior During Therapy WFL for tasks assessed/performed            Past Medical History:  Diagnosis Date   Adhesive capsulitis of right shoulder    ASCUS of cervix with negative high risk HPV    Diabetes mellitus without complication (HCC)    Migraine with aura and with status migrainosus, not intractable    Postmenopausal bleeding    SVT (supraventricular tachycardia) (HCC)    Past Surgical History:  Procedure Laterality Date   APPENDECTOMY     GALLBLADDER SURGERY  1998   SHOULDER SURGERY  2016   Patient Active Problem List   Diagnosis Date Noted   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 05/20/2024   Cervical cancer screening 02/20/2024   Melanocytic nevi of trunk 02/19/2024   Dyslipidemia, goal LDL below 70 07/30/2023   Chronic eczematous otitis externa of both ears 07/30/2023   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, with long-term current use of insulin  (HCC) 04/24/2023   Chronic renal disease, stage 2, mildly decreased glomerular filtration rate (GFR) between 60-89 mL/min/1.73 square meter 07/25/2022   Encounter for general adult medical examination with abnormal findings 01/23/2022   Paroxysmal SVT (supraventricular tachycardia) (HCC) 01/22/2022   Class 2 severe obesity due to excess calories with serious comorbidity and body mass index (BMI) of 36.0 to 36.9 in adult (HCC) 05/13/2021   Chronic migraine 12/16/2017    PCP: Joshua Debby CROME, MD  REFERRING PROVIDER: Jerri Kay HERO, MD  REFERRING  DIAG: M54.2 (ICD-10-CM) - Neck pain M25.512,G89.29 (ICD-10-CM) - Chronic left shoulder pain  THERAPY DIAG:  Acute pain of left shoulder  Muscle weakness (generalized)  Abnormal posture  Other lack of coordination  Rationale for Evaluation and Treatment: Rehabilitation  ONSET DATE: 4-5 months  SUBJECTIVE:  SUBJECTIVE STATEMENT: Pt states she notices less pain with sleeping but stretching arm out still hurts.    Eval: Pt states her shoulder has been bothering her for months. Has been interfering her sleep. Difficulty with turning of the lamp. Pt reports no known method of injury. Just noticed that her shoulder would wake her up in the middle of the night. States she typically likes to sleep on her stomach with her L arm outstretched but unable to do this now.  Hand dominance: Right  PERTINENT HISTORY:  Per referral: Left shoulder impingement, rotator cuff syndrome R frozen shoulder with rotator cuff repair 2016  PAIN:  Are you having pain? Yes: NPRS scale: at rest 0/10, sleeping 4/10, at worst (reaching) 6/10 Pain location: L shoulder (top and posterior/lateral) Pain description: can go from ache to a sharp Aggravating factors: Sleeping and reaching Relieving factors: Pain medication  PRECAUTIONS: None  RED FLAGS: None     WEIGHT BEARING RESTRICTIONS: No  FALLS:  Has patient fallen in last 6 months? 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  DIAGNOSTIC FINDINGS:  04/30/24  X-rays of the left shoulder show no acute or structural abnormalities   PATIENT SURVEYS:  PSFS: THE PATIENT SPECIFIC FUNCTIONAL SCALE  Place score of 0-10 (0 = unable to perform activity and 10 = able to perform activity at the same level as before injury or problem)  Activity Date: 05/20/24    Sleep 8    2. Reaching for things 6    3. Sudden movements 6    4.      AVERAGE Score 6.67      Total Score = Sum of activity scores/number of activities  Minimally Detectable Change: 3 points (for single activity); 2 points (for average score)  [facility] (nd). The Patient Specific Functional Scale . Retrieved from [email or web address]   COGNITION: Overall cognitive status: Within functional limits for tasks assessed  SENSATION: WFL  POSTURE: rounded shoulders  PALPATION: No overt tenderness about UTs, tenderness along L periscapular muscle (rhomboid/mid trap -- pt states this is chronic)   CERVICAL ROM:   Active ROM A/PROM (deg) eval  Flexion 100% can feel pull on L shoulder  Extension 100% can vaguely feel on L shoulder  Right lateral flexion 80%  Left lateral flexion 80%  Right rotation 100%  Left rotation 100%   (Blank rows = not tested)  UPPER EXTREMITY ROM:  Active ROM Right eval Left eval  Shoulder flexion 165 160 but can feel at 120 along lateral shoulder  Shoulder extension 70 70  Shoulder abduction 165 160 but pain at 125 deg on top of shoulder  Shoulder adduction    Shoulder extension    Shoulder internal rotation T5 T8 pain  Shoulder external rotation 70 35 discomfort  Elbow flexion    Elbow extension    Wrist flexion    Wrist extension    Wrist ulnar deviation    Wrist radial deviation    Wrist pronation    Wrist supination     (Blank rows = not tested)  UPPER EXTREMITY MMT:  MMT Right eval Left eval  Shoulder flexion 5 4+  Shoulder extension prone 4 3+  Shoulder abduction 5 3+  pain  Shoulder adduction    Shoulder extension 5 4-  Shoulder internal rotation 5 5  Shoulder external rotation 3+ 3+ pain  Middle trapezius prone 5 3+  Lower trapezius  Unable to test due to pain  Elbow flexion    Elbow extension    Wrist flexion    Wrist extension    Wrist ulnar deviation    Wrist radial deviation    Wrist pronation    Wrist supination    Grip strength     (Blank rows = not tested)  SHOULDER SPECIAL TESTS: Impingement tests: Painful arc test: positive   SLAP lesions: Biceps load test: negative Instability tests: Apprehension test: positive  Rotator cuff assessment: Full can test: positive  Biceps assessment: Did not assess  FUNCTIONAL TESTS:  Did not assess  TREATMENT DATE:  06/03/24  UBE 3 min each way Level 2 Tband rows 3x10 blue Tband ext 3x10 Blue Tband IR 3x10 blue Scaption with adjustments 1# and 3x10 Supine chest press with 3 # DB 3x10 Supine punches with 2# DB 3x10 Supine flexion to 90 degrees with 2# DB 2x10 Manual: PROM, scap mobs, TFM at supra insertion  05/27/24 UBE 2.5 min each way Tband rows 3x10 blue Tband ext 3x10 Blue Tband IR 3x10 blue Scaption with DB 2# 3x10  Ball against wall 15x CW/CCW  Manual: PROM, scap mobs, TFM at supra insertion     05/20/24 See HEP below. Trial of 10 reps performed in clinic                  PATIENT EDUCATION:  Education details: Exam findings, POC, initial HEP Person educated: Patient Education method: Explanation, Demonstration, and Handouts Education comprehension: verbalized understanding, returned demonstration, and needs further education  HOME EXERCISE PROGRAM: Access Code: [email or web address] URL: [email or web address] Date: 05/20/2024 Prepared by: Gellen April Earnie Starring  Exercises - Standing Bilateral Low Shoulder Row with Anchored Resistance  - 1 x daily - 7 x weekly - 2 sets - 10 reps - Shoulder extension with resistance - Neutral  - 1 x daily - 7 x weekly - 2 sets -  10 reps - Shoulder External Rotation Reactive Isometrics  - 1 x daily - 7 x weekly - 2 sets - 10 reps - 3 se hold - Shoulder Internal Rotation Reactive Isometrics  - 1 x daily - 7 x weekly - 2 sets - 10 reps - 3 sec hold  ASSESSMENT:  CLINICAL IMPRESSION: Decreased weight with scaption to 1# to improve flexion motion quality.  Pt reported no pain after adjustment. Pt demonstrated increased durance with an increase in time and resistance in UBE.   OBJECTIVE IMPAIRMENTS: decreased activity tolerance, decreased coordination, decreased mobility, decreased ROM, decreased strength, impaired UE functional use, improper body mechanics, postural dysfunction, and pain.   ACTIVITY LIMITATIONS: carrying, lifting, sleeping, reach over head, and hygiene/grooming  PARTICIPATION LIMITATIONS: meal prep, cleaning, laundry, shopping, and community activity  PERSONAL FACTORS: Age, Fitness, Past/current experiences, and Time since onset of injury/illness/exacerbation are also affecting patient's functional outcome.   REHAB POTENTIAL: Good  CLINICAL DECISION MAKING: Evolving/moderate complexity  EVALUATION COMPLEXITY: Moderate   GOALS: Goals reviewed with patient? Yes  SHORT TERM GOALS: Target date: 06/17/2024   Pt will be ind with initial HEP Baseline:  Goal status: INITIAL  2.  Pt will have full and pain free shoulder AROM Baseline: See ROM chart above Goal status: INITIAL   LONG TERM GOALS: Target date: 07/15/2024   Pt will be ind with management  and progression of HEP Baseline:  Goal status: INITIAL  2.  Pt will demo at least 4/5 shoulder strength for overhead reaching and lifting tasks Baseline: See MMT above Goal status: INITIAL  3.  Pt will have improved PSFS to >/=8.67 Baseline: 6.67 Goal status: INITIAL  4.  Pt will report improved overall pain by >/=75% Baseline: worst pain 6 Goal status: INITIAL    PLAN:  PT FREQUENCY: 1x/week  PT DURATION: 8 weeks  PLANNED  INTERVENTIONS: 97164- PT Re-evaluation, 97750- Physical Performance Testing, 97110-Therapeutic exercises, 97530- Therapeutic activity, [id number]- Neuromuscular re-education, 97535- Self Care, 02859- Manual therapy, G0283- Electrical stimulation (unattended), [id number]- Ultrasound, 02966- Ionotophoresis 4mg /ml Dexamethasone, 79439 (1-2 muscles), 20561 (3+ muscles)- Dry Needling, Patient/Family education, Taping, Joint mobilization, Cryotherapy, and Moist heat  PLAN FOR NEXT SESSION: Continue with focus on RC strengthening and stabilization.   Burnard CHRISTELLA Meth, PT 06/03/2024, 11:50 AM

## 2024-06-03 ENCOUNTER — Ambulatory Visit (INDEPENDENT_AMBULATORY_CARE_PROVIDER_SITE_OTHER)

## 2024-06-03 DIAGNOSIS — R293 Abnormal posture: Secondary | ICD-10-CM | POA: Diagnosis not present

## 2024-06-03 DIAGNOSIS — R278 Other lack of coordination: Secondary | ICD-10-CM | POA: Diagnosis not present

## 2024-06-03 DIAGNOSIS — M6281 Muscle weakness (generalized): Secondary | ICD-10-CM

## 2024-06-03 DIAGNOSIS — M25512 Pain in left shoulder: Secondary | ICD-10-CM

## 2024-06-09 NOTE — Therapy (Addendum)
 OUTPATIENT PHYSICAL THERAPY SHOULDER TREATMENT/DC   Patient Name: Shelly Whitehead MRN: [id number] DOB:1966/10/01, 58 y.o., female Today's Date: 06/10/2024  END OF SESSION:  PT End of Session - 06/10/24 1132     Visit Number 4    Number of Visits 8    Date for PT Re-Evaluation 07/15/24    Authorization Type [employer]    PT Start Time 1048    PT Stop Time 1127    PT Time Calculation (min) 39 min    Activity Tolerance Patient tolerated treatment well    Behavior During Therapy WFL for tasks assessed/performed             Past Medical History:  Diagnosis Date   Adhesive capsulitis of right shoulder    ASCUS of cervix with negative high risk HPV    Diabetes mellitus without complication (HCC)    Migraine with aura and with status migrainosus, not intractable    Postmenopausal bleeding    SVT (supraventricular tachycardia) (HCC)    Past Surgical History:  Procedure Laterality Date   APPENDECTOMY     GALLBLADDER SURGERY  1998   SHOULDER SURGERY  2016   Patient Active Problem List   Diagnosis Date Noted   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 05/20/2024   Cervical cancer screening 02/20/2024   Melanocytic nevi of trunk 02/19/2024   Dyslipidemia, goal LDL below 70 07/30/2023   Chronic eczematous otitis externa of both ears 07/30/2023   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, with long-term current use of insulin  (HCC) 04/24/2023   Chronic renal disease, stage 2, mildly decreased glomerular filtration rate (GFR) between 60-89 mL/min/1.73 square meter 07/25/2022   Encounter for general adult medical examination with abnormal findings 01/23/2022   Paroxysmal SVT (supraventricular tachycardia) (HCC) 01/22/2022   Class 2 severe obesity due to excess calories with serious comorbidity and body mass index (BMI) of 36.0 to 36.9 in adult (HCC) 05/13/2021   Chronic migraine 12/16/2017    PCP: Joshua Debby CROME, MD  REFERRING PROVIDER: Jerri Kay HERO,  MD  REFERRING DIAG: M54.2 (ICD-10-CM) - Neck pain M25.512,G89.29 (ICD-10-CM) - Chronic left shoulder pain  THERAPY DIAG:  Acute pain of left shoulder  Muscle weakness (generalized)  Other lack of coordination  Abnormal posture  Rationale for Evaluation and Treatment: Rehabilitation  ONSET DATE: 4-5 months  SUBJECTIVE:  SUBJECTIVE STATEMENT: Pt reports arm feels sore affecting reaching.    Eval: Pt states her shoulder has been bothering her for months. Has been interfering her sleep. Difficulty with turning of the lamp. Pt reports no known method of injury. Just noticed that her shoulder would wake her up in the middle of the night. States she typically likes to sleep on her stomach with her L arm outstretched but unable to do this now.  Hand dominance: Right  PERTINENT HISTORY:  Per referral: Left shoulder impingement, rotator cuff syndrome R frozen shoulder with rotator cuff repair 2016  PAIN:  Are you having pain? Yes: NPRS scale: at rest 0/10, sleeping 2/10, at worst (reaching) 5/10 Pain location: L shoulder (top and posterior/lateral) Pain description: can go from ache to a sharp Aggravating factors: Sleeping and reaching Relieving factors: Pain medication  PRECAUTIONS: None  RED FLAGS: None     WEIGHT BEARING RESTRICTIONS: No  FALLS:  Has patient fallen in last 6 months? 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  DIAGNOSTIC FINDINGS:  04/30/24 X-rays of the left  shoulder show no acute or structural abnormalities   PATIENT SURVEYS:  PSFS: THE PATIENT SPECIFIC FUNCTIONAL SCALE  Place score of 0-10 (0 = unable to perform activity and 10 = able to perform activity at the same level as before injury or problem)  Activity Date: 05/20/24    Sleep 8    2. Reaching for things 6    3. Sudden movements 6    4.      AVERAGE Score 6.67      Total Score = Sum of activity scores/number of activities  Minimally Detectable Change: 3 points (for single activity); 2 points (for average score)  [facility] (nd). The Patient Specific Functional Scale . Retrieved from [email or web address]   COGNITION: Overall cognitive status: Within functional limits for tasks assessed  SENSATION: WFL  POSTURE: rounded shoulders  PALPATION: No overt tenderness about UTs, tenderness along L periscapular muscle (rhomboid/mid trap -- pt states this is chronic)   CERVICAL ROM:   Active ROM A/PROM (deg) eval  Flexion 100% can feel pull on L shoulder  Extension 100% can vaguely feel on L shoulder  Right lateral flexion 80%  Left lateral flexion 80%  Right rotation 100%  Left rotation 100%   (Blank rows = not tested)  UPPER EXTREMITY ROM:  Active ROM Right eval Left eval  Shoulder flexion 165 160 but can feel at 120 along lateral shoulder  Shoulder extension 70 70  Shoulder abduction 165 160 but pain at 125 deg on top of shoulder  Shoulder adduction    Shoulder extension    Shoulder internal rotation T5 T8 pain  Shoulder external rotation 70 35 discomfort  Elbow flexion    Elbow extension    Wrist flexion    Wrist extension    Wrist ulnar deviation    Wrist radial deviation    Wrist pronation    Wrist supination     (Blank rows = not tested)  UPPER EXTREMITY MMT:  MMT Right eval Left eval  Shoulder flexion 5 4+  Shoulder extension prone 4 3+  Shoulder abduction 5 3+ pain  Shoulder  adduction  Shoulder extension 5 4-  Shoulder internal rotation 5 5  Shoulder external rotation 3+ 3+ pain  Middle trapezius prone 5 3+  Lower trapezius  Unable to test due to pain  Elbow flexion    Elbow extension    Wrist flexion    Wrist extension    Wrist ulnar deviation    Wrist radial deviation    Wrist pronation    Wrist supination    Grip strength     (Blank rows = not tested)  SHOULDER SPECIAL TESTS: Impingement tests: Painful arc test: positive   SLAP lesions: Biceps load test: negative Instability tests: Apprehension test: positive  Rotator cuff assessment: Full can test: positive  Biceps assessment: Did not assess  FUNCTIONAL TESTS:  Did not assess  TREATMENT DATE:  06/10/24  [id number] UBE 3 min each way Level 2 Tband rows 3x10 blue Tband ext 3x10 Blue Tband IR 3x10 blue ER Isometrics with ball 3x10 Scaption with adjustments 1# and 3x10 Supine chest press with 3 # DB 3x10 Supine punches with 2# DB 2x15 Supine flexion to 90 degrees with 2# DB 2x10 Manual: PROM, scap mobs, TFM at supra insertion     06/03/24  UBE 3 min each way Level 2 Tband rows 3x10 blue Tband ext 3x10 Blue Tband IR 3x10 blue Scaption with adjustments 1# and 3x10 Supine chest press with 3 # DB 3x10 Supine punches with 2# DB 3x10 Supine flexion to 90 degrees with 2# DB 2x10 Manual: PROM, scap mobs, TFM at supra insertion  05/27/24 UBE 2.5 min each way Tband rows 3x10 blue Tband ext 3x10 Blue Tband IR 3x10 blue Scaption with DB 2# 3x10  Ball against wall 15x CW/CCW  Manual: PROM, scap mobs, TFM at supra insertion     05/20/24 See HEP below. Trial of 10 reps performed in clinic                  PATIENT EDUCATION:  Education details: Exam findings, POC, initial HEP Person educated: Patient Education method: Explanation, Demonstration, and Handouts Education comprehension: verbalized understanding, returned demonstration, and needs further education  HOME EXERCISE  PROGRAM: Access Code: [email or web address] URL: [email or web address] Date: 05/20/2024 Prepared by: Gellen April Earnie Starring  Exercises - Standing Bilateral Low Shoulder Row with Anchored Resistance  - 1 x daily - 7 x weekly - 2 sets - 10 reps - Shoulder extension with resistance - Neutral  - 1 x daily - 7 x weekly - 2 sets - 10 reps - Shoulder External Rotation Reactive Isometrics  - 1 x daily - 7 x weekly - 2 sets - 10 reps - 3 se hold - Shoulder Internal Rotation Reactive Isometrics  - 1 x daily - 7 x weekly - 2 sets - 10 reps - 3 sec hold  ASSESSMENT:  CLINICAL IMPRESSION: Still some pain felt with passive ER decreased with manual distraction.  No pain with ER isometrics.   OBJECTIVE IMPAIRMENTS: decreased activity tolerance, decreased coordination, decreased mobility, decreased ROM, decreased strength, impaired UE functional use, improper body mechanics, postural dysfunction, and pain.   ACTIVITY LIMITATIONS: carrying, lifting, sleeping, reach over head, and hygiene/grooming  PARTICIPATION LIMITATIONS: meal prep, cleaning, laundry, shopping, and community activity  PERSONAL FACTORS: Age, Fitness, Past/current experiences, and Time since onset of injury/illness/exacerbation are also affecting patient's functional outcome.   REHAB POTENTIAL: Good  CLINICAL DECISION MAKING: Evolving/moderate complexity  EVALUATION COMPLEXITY: Moderate   GOALS: Goals reviewed with patient? Yes  SHORT TERM GOALS: Target date: 06/17/2024   Pt  will be ind with initial HEP Baseline:  Goal status: INITIAL  2.  Pt will have full and pain free shoulder AROM Baseline: See ROM chart above Goal status: INITIAL   LONG TERM GOALS: Target date: 07/15/2024   Pt will be ind with management and progression of HEP Baseline:  Goal status: INITIAL  2.  Pt will demo at least 4/5 shoulder strength for overhead reaching and lifting tasks Baseline: See MMT above Goal status: INITIAL  3.  Pt will  have improved PSFS to >/=8.67 Baseline: 6.67 Goal status: INITIAL  4.  Pt will report improved overall pain by >/=75% Baseline: worst pain 6 Goal status: INITIAL    PLAN:  PT FREQUENCY: 1x/week  PT DURATION: 8 weeks  PLANNED INTERVENTIONS: 97164- PT Re-evaluation, 97750- Physical Performance Testing, 97110-Therapeutic exercises, 97530- Therapeutic activity, [id number]- Neuromuscular re-education, 97535- Self Care, 02859- Manual therapy, G0283- Electrical stimulation (unattended), [id number]- Ultrasound, 02966- Ionotophoresis 4mg /ml Dexamethasone, 79439 (1-2 muscles), 20561 (3+ muscles)- Dry Needling, Patient/Family education, Taping, Joint mobilization, Cryotherapy, and Moist heat  PLAN FOR NEXT SESSION: Continue with focus on RC strengthening and stabilization.   Burnard CHRISTELLA Meth, PT 06/10/2024, 11:33 AM  PHYSICAL THERAPY DISCHARGE SUMMARY  Visits from Start of Care: 5  Current functional level related to goals / functional outcomes: See above   Remaining deficits: See above   Education / Equipment: HEP   Patient agrees to discharge. Patient goals were partially met. Patient is being discharged due to not returning since the last visit.

## 2024-06-10 ENCOUNTER — Ambulatory Visit (INDEPENDENT_AMBULATORY_CARE_PROVIDER_SITE_OTHER)

## 2024-06-10 DIAGNOSIS — R278 Other lack of coordination: Secondary | ICD-10-CM | POA: Diagnosis not present

## 2024-06-10 DIAGNOSIS — M6281 Muscle weakness (generalized): Secondary | ICD-10-CM

## 2024-06-10 DIAGNOSIS — R293 Abnormal posture: Secondary | ICD-10-CM | POA: Diagnosis not present

## 2024-06-10 DIAGNOSIS — M25512 Pain in left shoulder: Secondary | ICD-10-CM | POA: Diagnosis not present

## 2024-06-11 ENCOUNTER — Other Ambulatory Visit: Payer: Self-pay

## 2024-06-11 ENCOUNTER — Other Ambulatory Visit (HOSPITAL_COMMUNITY): Payer: Self-pay

## 2024-06-14 ENCOUNTER — Other Ambulatory Visit: Payer: Self-pay

## 2024-06-14 ENCOUNTER — Ambulatory Visit (INDEPENDENT_AMBULATORY_CARE_PROVIDER_SITE_OTHER)

## 2024-06-14 ENCOUNTER — Encounter (HOSPITAL_COMMUNITY): Payer: Self-pay

## 2024-06-14 ENCOUNTER — Other Ambulatory Visit (HOSPITAL_COMMUNITY): Payer: Self-pay

## 2024-06-14 ENCOUNTER — Ambulatory Visit (HOSPITAL_COMMUNITY): Payer: Self-pay | Admitting: Physician Assistant

## 2024-06-14 ENCOUNTER — Ambulatory Visit (HOSPITAL_COMMUNITY)
Admission: RE | Admit: 2024-06-14 | Discharge: 2024-06-14 | Disposition: A | Discharge status: Home / Self Care | Source: Ambulatory Visit | Attending: Physician Assistant | Admitting: Physician Assistant

## 2024-06-14 VITALS — BP 106/73 | HR 93 | Temp 98.2°F | Resp 16

## 2024-06-14 DIAGNOSIS — U071 COVID-19: Secondary | ICD-10-CM

## 2024-06-14 DIAGNOSIS — R051 Acute cough: Secondary | ICD-10-CM | POA: Diagnosis not present

## 2024-06-14 LAB — POC SARS CORONAVIRUS 2 AG -  ED: SARS Coronavirus 2 Ag: POSITIVE — AB

## 2024-06-14 MED ORDER — ALBUTEROL SULFATE HFA 108 (90 BASE) MCG/ACT IN AERS
INHALATION_SPRAY | RESPIRATORY_TRACT | Status: AC
  Filled 2024-06-14: qty 6.7

## 2024-06-14 MED ORDER — BENZONATATE 100 MG PO CAPS
100.0000 mg | ORAL_CAPSULE | Freq: Three times a day (TID) | ORAL | 0 refills | Status: DC
  Filled 2024-06-14: qty 21, 7d supply, fill #0

## 2024-06-14 MED ORDER — AEROCHAMBER PLUS FLO-VU LARGE MISC: 1.0000 | Freq: Once | Status: DC

## 2024-06-14 MED ORDER — ALBUTEROL SULFATE HFA 108 (90 BASE) MCG/ACT IN AERS
2.0000 | INHALATION_SPRAY | Freq: Once | RESPIRATORY_TRACT | Status: AC
  Administered 2024-06-14: 2 via RESPIRATORY_TRACT

## 2024-06-14 MED ORDER — PAXLOVID (150/100) 10 X 150 MG & 10 X 100MG PO TBPK
2.0000 | ORAL_TABLET | Freq: Two times a day (BID) | ORAL | 0 refills | Status: AC
Start: 2024-06-14 — End: 2024-06-19
  Filled 2024-06-14: qty 20, 5d supply, fill #0

## 2024-06-14 NOTE — ED Triage Notes (Signed)
 Pt states she has cough, sore throat and ear pain X 3 days ago. States her chest hurts from coughing so much. She started taking tylenol cold and flu yesterday which did help.

## 2024-06-14 NOTE — ED Provider Notes (Signed)
 [facility]    CSN: [id number] Arrival date & time: 06/14/24  0813      History   Chief Complaint Chief Complaint  Patient presents with   Cough    Fever - Entered by patient   Sore Throat   Otalgia    HPI Shelly Whitehead is a 58 y.o. female.   Patient presents today with a 3-day history of URI symptoms.  She reports associated cough, chest tightness, sore throat, otalgia.  She has been taking Tylenol Cold and flu without improvement of symptoms.  She does report someone at her place of employment who has been under the weather but does not know what they were diagnosed with.  She has never had COVID but has had COVID vaccines.  She was treated with Keflex  for cellulitis of her toe but has not had additional antibiotics in the past 90 days.  Denies any recent steroid use.  She does report using an albuterol  inhaler when she was living in a different state but has not required one recently denies formal diagnosis of asthma, COPD, smoking.    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 05/20/2024   Cervical cancer screening 02/20/2024   Melanocytic nevi of trunk 02/19/2024   Dyslipidemia, goal LDL below 70 07/30/2023   Chronic eczematous otitis externa of both ears 07/30/2023   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, with long-term current use of insulin  (HCC) 04/24/2023   Chronic renal disease, stage 2, mildly decreased glomerular filtration rate (GFR) between 60-89 mL/min/1.73 square meter 07/25/2022   Encounter for general adult medical examination with abnormal findings 01/23/2022    Paroxysmal SVT (supraventricular tachycardia) (HCC) 01/22/2022   Class 2 severe obesity due to excess calories with serious comorbidity and body mass index (BMI) of 36.0 to 36.9 in adult (HCC) 05/13/2021   Chronic migraine 12/16/2017    Past Surgical History:  Procedure Laterality Date   APPENDECTOMY     GALLBLADDER SURGERY  1998   SHOULDER SURGERY  2016    OB History     Gravida  0   Para  0   Term  0   Preterm  0   AB  0   Living  0      SAB  0   IAB  0   Ectopic  0   Multiple  0   Live Births  0            Home Medications    Prior to Admission medications   Medication Sig Start Date End Date Taking? Authorizing Provider  atorvastatin  (LIPITOR) 20 MG tablet Take 1 tablet (20 mg total) by mouth daily. 01/22/24  Yes Joshua Debby CROME, MD  benzonatate  (TESSALON ) 100 MG capsule Take 1 capsule (100 mg total) by mouth every 8 (eight) hours. 06/14/24  Yes Sam Wunschel, Rocky K, PA-C  metoprolol  succinate (TOPROL -XL) 50 MG 24 hr tablet Take 1 tablet (50 mg total) by mouth daily. Take with or immediately following a meal. 05/12/24 08/10/24 Yes Kate Lonni CROME, MD  nirmatrelvir /ritonavir , renal dosing, (PAXLOVID , 150/100,) 10 x 150  MG & 10 x 100MG  TBPK Take 2 tablets by mouth 2 (two) times daily for 5 days. Dosage for moderate renal impairment (eGFR >/= 30 to <60 mL/min): 150 mg nirmatrelvir  (one 150 mg tablet) with 100 mg ritonavir  (one 100 mg tablet), with both tablets taken together twice daily for 5 days. Not recommended if eGFR < 30 mL/min.  PAXLOVID  is not recommend in patients with severe hepatic impairment (Child-Pugh Class C). 06/14/24 06/19/24 Yes Akaiya Touchette K, PA-C  Semaglutide , 2 MG/DOSE, 8 MG/3ML SOPN Inject 2 mg as directed once a week. 05/13/24  Yes Shamleffer, Ibtehal Jaralla, MD  Continuous Glucose Sensor (DEXCOM G7 SENSOR) MISC Apply 1 sensor every 10 days. 01/08/24   Shamleffer, Ibtehal Jaralla, MD  naproxen  (NAPROSYN ) 500 MG tablet Take 1 tablet (500 mg total)  by mouth 2 (two) times daily with a meal. 04/30/24   Jerri Kay HERO, MD  insulin  detemir (LEVEMIR  FLEXPEN) 100 UNIT/ML FlexPen Inject 24 Units into the skin daily. Patient taking differently: Inject 20 Units into the skin daily. 03/29/22 10/17/22  Shamleffer, Donell Cardinal, MD    Family History Family History  Problem Relation Age of Onset   Alzheimer's disease Mother    Bladder Cancer Father    Breast cancer Neg Hx    BRCA 1/2 Neg Hx     Social History Social History   Tobacco Use   Smoking status: Never   Smokeless tobacco: Never  Vaping Use   Vaping status: Never Used  Substance Use Topics   Alcohol use: Yes    Alcohol/week: 3.0 standard drinks of alcohol    Types: 3 Cans of beer per week   Drug use: Never     Allergies   Jardiance  [empagliflozin ], Metformin, Oxycodone, and Oxycodone-acetaminophen   Review of Systems Review of Systems  Constitutional:  Positive for activity change, fatigue and fever. Negative for appetite change.  HENT:  Positive for congestion and sinus pressure. Negative for sneezing and sore throat.   Respiratory:  Positive for cough. Negative for shortness of breath.   Cardiovascular:  Negative for chest pain.  Gastrointestinal:  Positive for diarrhea. Negative for abdominal pain, nausea and vomiting.  Musculoskeletal:  Positive for arthralgias and myalgias.  Neurological:  Positive for headaches. Negative for dizziness and light-headedness.     Physical Exam Triage Vital Signs ED Triage Vitals  Encounter Vitals Group     BP 06/14/24 0839 106/73     Girls Systolic BP Percentile --      Girls Diastolic BP Percentile --      Boys Systolic BP Percentile --      Boys Diastolic BP Percentile --      Pulse Rate 06/14/24 0839 93     Resp 06/14/24 0839 16     Temp 06/14/24 0839 98.2 F (36.8 C)     Temp src --      SpO2 06/14/24 0839 94 %     Weight --      Height --      Head Circumference --      Peak Flow --      Pain Score 06/14/24  0838 5     Pain Loc --      Pain Education --      Exclude from Growth Chart --    No data found.  Updated Vital Signs BP 106/73 (BP Location: Right Arm)   Pulse 93   Temp 98.2 F (36.8 C)   Resp 16   SpO2 94%  Visual Acuity Right Eye Distance:   Left Eye Distance:   Bilateral Distance:    Right Eye Near:   Left Eye Near:    Bilateral Near:     Physical Exam Vitals reviewed.  Constitutional:      General: She is awake. She is not in acute distress.    Appearance: Normal appearance. She is well-developed. She is not ill-appearing.     Comments: Very pleasant female appears stated age in no acute distress sitting comfortably in exam room  HENT:     Head: Normocephalic and atraumatic.     Right Ear: Tympanic membrane, ear canal and external ear normal. Tympanic membrane is not erythematous or bulging.     Left Ear: Tympanic membrane, ear canal and external ear normal. Tympanic membrane is not erythematous or bulging.     Nose:     Right Sinus: No maxillary sinus tenderness or frontal sinus tenderness.     Left Sinus: No maxillary sinus tenderness or frontal sinus tenderness.     Mouth/Throat:     Pharynx: Uvula midline. Postnasal drip present. No oropharyngeal exudate or posterior oropharyngeal erythema.  Cardiovascular:     Rate and Rhythm: Normal rate and regular rhythm.     Heart sounds: Normal heart sounds, S1 normal and S2 normal. No murmur heard. Pulmonary:     Effort: Pulmonary effort is normal.     Breath sounds: Examination of the right-lower field reveals decreased breath sounds. Examination of the left-lower field reveals decreased breath sounds. Decreased breath sounds present. No wheezing, rhonchi or rales.  Psychiatric:        Behavior: Behavior is cooperative.      UC Treatments / Results  Labs (all labs ordered are listed, but only abnormal results are displayed) Labs Reviewed  POC SARS CORONAVIRUS 2 AG -  ED - Abnormal; Notable for the following  components:      Result Value   SARS Coronavirus 2 Ag Positive (*)    All other components within normal limits    EKG   Radiology No results found.  Procedures Procedures (including critical care time)  Medications Ordered in UC Medications  albuterol  (VENTOLIN  HFA) 108 (90 Base) MCG/ACT inhaler 2 puff (2 puffs Inhalation Given 06/14/24 0918)    Initial Impression / Assessment and Plan / UC Course  I have reviewed the triage vital signs and the nursing notes.  Pertinent labs & imaging results that were available during my care of the patient were reviewed by me and considered in my medical decision making (see chart for details).     Patient is well-appearing, afebrile, nontoxic, nontachycardic.  No evidence of acute infection on physical exam that would want initiation of antibiotics.  She does have positive for COVID-19.  Chest x-ray was obtained that showed no acute cardiopulmonary disease based on my primary read.  At the time of discharge you were waiting for radiologist overread and we will contact her if this differs changes her treatment plan.  She was given albuterol  in clinic with some improvement of symptoms and sent home with this medication to be used every 4-6 hours as needed.  She was given Tessalon  for cough.  Recommend over-the-counter medication including Mucinex, Flonase, Tylenol, nasal saline/sinus rinses for additional symptom relief.  She does have a history of diabetes and so is a candidate for Paxlovid .  This was send based on her last metabolic panel from 02/19/2024 with low EGFR of 55 mL/min and so medication was renally dosed.  Recommend that she obtain a pulse oximeter and monitor oxygen saturations if drops below 90% she is to go to the ER and if below 93% she should be reevaluated.  We discussed that if she is not feeling better in a week she should return or if anything worsens she needs to be seen immediately.  Discharge return precautions given.  All  questions were answered to patient's satisfaction.  Final Clinical Impressions(s) / UC Diagnoses   Final diagnoses:  Acute cough  COVID-19     Discharge Instructions      I have called in Tessalon  to help with your cough.  Start Paxlovid  twice daily for 5 days.  Stop atorvastatin  while on this medication for 3 days after you finish the medicine.  You can continue all of your other medication as prescribed.  Obtain a pulse oximeter from the pharmacy.  Monitor your oxygen saturation if the drops below 90% you need to go to the emergency room and if below 93% consistently return here.  If you are not feeling better in a week please come back so we can reassess you.  If anything worsens and you have high fever, worsening cough, shortness of breath, chest pain, nausea/vomiting you need to be seen immediately.     ED Prescriptions     Medication Sig Dispense Auth. Provider   benzonatate  (TESSALON ) 100 MG capsule Take 1 capsule (100 mg total) by mouth every 8 (eight) hours. 21 capsule Annaleah Arata K, PA-C   nirmatrelvir /ritonavir , renal dosing, (PAXLOVID , 150/100,) 10 x 150 MG & 10 x 100MG  TBPK Take 2 tablets by mouth 2 (two) times daily for 5 days. Dosage for moderate renal impairment (eGFR >/= 30 to <60 mL/min): 150 mg nirmatrelvir  (one 150 mg tablet) with 100 mg ritonavir  (one 100 mg tablet), with both tablets taken together twice daily for 5 days. Not recommended if eGFR < 30 mL/min.  PAXLOVID  is not recommend in patients with severe hepatic impairment (Child-Pugh Class C). 20 tablet Raela Bohl K, PA-C      PDMP not reviewed this encounter.   Sherrell Rocky POUR, PA-C 06/14/24 9053

## 2024-06-14 NOTE — Discharge Instructions (Signed)
 I have called in Tessalon  to help with your cough.  Start Paxlovid  twice daily for 5 days.  Stop atorvastatin  while on this medication for 3 days after you finish the medicine.  You can continue all of your other medication as prescribed.  Obtain a pulse oximeter from the pharmacy.  Monitor your oxygen saturation if the drops below 90% you need to go to the emergency room and if below 93% consistently return here.  If you are not feeling better in a week please come back so we can reassess you.  If anything worsens and you have high fever, worsening cough, shortness of breath, chest pain, nausea/vomiting you need to be seen immediately.

## 2024-06-15 ENCOUNTER — Other Ambulatory Visit (HOSPITAL_COMMUNITY): Payer: Self-pay

## 2024-06-16 ENCOUNTER — Other Ambulatory Visit (HOSPITAL_COMMUNITY): Payer: Self-pay

## 2024-06-16 ENCOUNTER — Encounter

## 2024-06-23 ENCOUNTER — Encounter

## 2024-07-01 ENCOUNTER — Encounter: Payer: Self-pay | Admitting: Orthopaedic Surgery

## 2024-07-01 ENCOUNTER — Other Ambulatory Visit: Payer: Self-pay

## 2024-07-01 DIAGNOSIS — G8929 Other chronic pain: Secondary | ICD-10-CM

## 2024-07-01 NOTE — Telephone Encounter (Signed)
 Please order MRI of the shoulder.  Thank you.

## 2024-07-06 ENCOUNTER — Encounter: Payer: Self-pay | Admitting: Internal Medicine

## 2024-07-06 ENCOUNTER — Ambulatory Visit (INDEPENDENT_AMBULATORY_CARE_PROVIDER_SITE_OTHER)

## 2024-07-06 ENCOUNTER — Ambulatory Visit: Admitting: Internal Medicine

## 2024-07-06 ENCOUNTER — Ambulatory Visit: Payer: Self-pay | Admitting: Internal Medicine

## 2024-07-06 VITALS — BP 112/70 | HR 73 | Temp 97.8°F | Resp 16 | Ht 67.0 in | Wt 199.8 lb

## 2024-07-06 DIAGNOSIS — I471 Supraventricular tachycardia, unspecified: Secondary | ICD-10-CM

## 2024-07-06 DIAGNOSIS — J189 Pneumonia, unspecified organism: Secondary | ICD-10-CM | POA: Insufficient documentation

## 2024-07-06 NOTE — Progress Notes (Signed)
 Subjective:  Patient ID: Shelly Whitehead, female    DOB: January 28, 1966  Age: 58 y.o. MRN: [id number]  CC: Cough   HPI Shelly Whitehead presents for f/up ----  Discussed the use of AI scribe software for clinical note transcription with the patient, who gave verbal consent to proceed.  History of Present Illness Shelly Whitehead is a 57 year old female who presents for follow-up after a recent COVID-19 infection.  Three weeks ago, she contracted COVID-19 and is currently experiencing a lingering cough and mild fatigue. No fevers, chills, night sweats, chest pain, or shortness of breath. She has discontinued the use of a cough suppressant.  A chest x-ray performed a couple of weeks ago was reported by the radiologist as possibly showing pneumonia in the left lower lobe. She received a pneumonia vaccine three years ago and is due for a booster, but prefers to wait until her upcoming physical due to a previous adverse reaction.  She plans to see a cardiologist next month. No stomach issues, nausea, vomiting, diarrhea, constipation, or pain and swelling.   Outpatient Medications Prior to Visit  Medication Sig Dispense Refill   atorvastatin  (LIPITOR) 20 MG tablet Take 1 tablet (20 mg total) by mouth daily. 90 tablet 1   Continuous Glucose Sensor (DEXCOM G7 SENSOR) MISC Apply 1 sensor every 10 days. 9 each 3   metoprolol  succinate (TOPROL -XL) 50 MG 24 hr tablet Take 1 tablet (50 mg total) by mouth daily. Take with or immediately following a meal. 90 tablet 3   Semaglutide , 2 MG/DOSE, 8 MG/3ML SOPN Inject 2 mg as directed once a week. 9 mL 3   benzonatate  (TESSALON ) 100 MG capsule Take 1 capsule (100 mg total) by mouth every 8 (eight) hours. 21 capsule 0   naproxen  (NAPROSYN ) 500 MG tablet Take 1 tablet (500 mg total) by mouth 2 (two) times daily with a meal. 30 tablet 3   No facility-administered medications prior to visit.    ROS Review of Systems  Constitutional:  Positive for fatigue. Negative  for appetite change, chills, diaphoresis, fever and unexpected weight change.  HENT: Negative.  Negative for sinus pressure and sore throat.   Eyes: Negative.   Respiratory:  Positive for cough. Negative for chest tightness, shortness of breath and wheezing.   Cardiovascular:  Negative for chest pain, palpitations and leg swelling.  Gastrointestinal: Negative.  Negative for abdominal pain, constipation, diarrhea, nausea and vomiting.  Endocrine: Negative.   Genitourinary: Negative.  Negative for difficulty urinating.  Skin: Negative.   Neurological: Negative.  Negative for dizziness, weakness and light-headedness.  Hematological:  Negative for adenopathy. Does not bruise/bleed easily.  Psychiatric/Behavioral: Negative.      Objective:  BP 112/70 (BP Location: Left Arm, Patient Position: Sitting)   Pulse 73   Temp 97.8 F (36.6 C) (Oral)   Resp 16   Ht 5' 7 (1.702 m)   Wt 199 lb 12.8 oz (90.6 kg)   SpO2 99%   BMI 31.29 kg/m   BP Readings from Last 3 Encounters:  07/06/24 112/70  06/14/24 106/73  05/20/24 124/80    Wt Readings from Last 3 Encounters:  07/06/24 199 lb 12.8 oz (90.6 kg)  05/20/24 204 lb 6.4 oz (92.7 kg)  05/13/24 208 lb (94.3 kg)    Physical Exam Vitals reviewed.  Constitutional:      Appearance: Normal appearance.  HENT:     Mouth/Throat:     Mouth: Mucous membranes are moist.  Eyes:     General:  No scleral icterus.    Conjunctiva/sclera: Conjunctivae normal.  Cardiovascular:     Rate and Rhythm: Normal rate and regular rhythm.     Heart sounds: No murmur heard.    No friction rub. No gallop.  Pulmonary:     Effort: Pulmonary effort is normal.     Breath sounds: No stridor. No wheezing, rhonchi or rales.  Abdominal:     General: Abdomen is flat.     Palpations: There is no mass.     Tenderness: There is no abdominal tenderness. There is no guarding.     Hernia: No hernia is present.  Musculoskeletal:        General: Normal range of motion.      Cervical back: Neck supple.     Right lower leg: No edema.     Left lower leg: No edema.  Lymphadenopathy:     Cervical: No cervical adenopathy.  Skin:    General: Skin is warm and dry.     Findings: No rash.  Neurological:     General: No focal deficit present.     Mental Status: She is alert.     Lab Results  Component Value Date   WBC 4.7 02/19/2024   HGB 15.0 02/19/2024   HCT 44.5 02/19/2024   PLT 253.0 02/19/2024   GLUCOSE 152 (H) 02/19/2024   CHOL 117 02/19/2024   TRIG 108.0 02/19/2024   HDL 48.00 02/19/2024   LDLCALC 47 02/19/2024   ALT 25 02/19/2024   AST 23 02/19/2024   NA 137 02/19/2024   K 4.5 02/19/2024   CL 102 02/19/2024   CREATININE 1.11 02/19/2024   BUN 15 02/19/2024   CO2 29 02/19/2024   TSH 2.08 02/19/2024   HGBA1C 6.1 (A) 05/13/2024   MICROALBUR <0.7 02/19/2024    DG Chest 2 View Result Date: 06/14/2024 CLINICAL DATA:  Cough and sore throat for 3 days. EXAM: CHEST - 2 VIEW COMPARISON:  Cardiac CT 12/27/2022 FINDINGS: The cardiac silhouette, mediastinal and hilar contours are within normal limits. Increased density in the left lower lobe behind the heart on the PA film. This is not as evident on the lateral film but could represent atelectasis or developing infiltrate. No pleural effusions or pulmonary lesions. The bony thorax is intact. IMPRESSION: Left lower lobe airspace opacity could reflect atelectasis or infiltrate. Electronically Signed   By: MYRTIS Stammer M.D.   On: 06/14/2024 09:57   DG Chest 2 View Result Date: 07/06/2024 CLINICAL DATA:  LLL PNA f/up. EXAM: CHEST - 2 VIEW COMPARISON:  06/14/2024. FINDINGS: Bilateral lung fields are clear. No left lower lobe pneumonia seen. Bilateral costophrenic angles are clear. Normal cardio-mediastinal silhouette. No acute osseous abnormalities. The soft tissues are within normal limits. There are surgical clips in the right upper quadrant, typical of a previous cholecystectomy. IMPRESSION: No active  cardiopulmonary disease. Electronically Signed   By: Ree Molt M.D.   On: 07/06/2024 08:52      Assessment & Plan:   Pneumonia of left lower lobe due to infectious organism- CXR is normal now. -     DG Chest 2 View; Future  Paroxysmal SVT (supraventricular tachycardia) (HCC)- She has good R/R control.     Follow-up: Return in about 3 months (around 10/06/2024).  Debby Molt, MD

## 2024-07-06 NOTE — Patient Instructions (Signed)

## 2024-07-09 ENCOUNTER — Encounter (HOSPITAL_COMMUNITY): Payer: Self-pay

## 2024-07-09 ENCOUNTER — Other Ambulatory Visit (HOSPITAL_COMMUNITY): Payer: Self-pay

## 2024-07-09 ENCOUNTER — Encounter: Payer: Self-pay | Admitting: Orthopaedic Surgery

## 2024-07-14 ENCOUNTER — Telehealth: Payer: Self-pay

## 2024-07-14 ENCOUNTER — Encounter: Payer: Self-pay | Admitting: Internal Medicine

## 2024-07-14 ENCOUNTER — Ambulatory Visit
Admission: RE | Admit: 2024-07-14 | Discharge: 2024-07-14 | Disposition: A | Discharge status: Home / Self Care | Source: Ambulatory Visit | Attending: Orthopaedic Surgery | Admitting: Orthopaedic Surgery

## 2024-07-14 DIAGNOSIS — G8929 Other chronic pain: Secondary | ICD-10-CM

## 2024-07-14 NOTE — Telephone Encounter (Signed)
 Dexcom needs PA

## 2024-07-14 NOTE — Telephone Encounter (Signed)
 Pharmacy Patient Advocate Encounter   Received notification from Pt Calls Messages that prior authorization for Dexcom G7 sensor is required/requested.   Insurance verification completed.   The patient is insured through [facility] .   Per test claim: PA required; PA submitted to above mentioned insurance via Latent Key/confirmation #/EOC [id number] Status is pending

## 2024-07-15 ENCOUNTER — Other Ambulatory Visit (HOSPITAL_COMMUNITY): Payer: Self-pay

## 2024-07-15 ENCOUNTER — Other Ambulatory Visit: Payer: Self-pay

## 2024-07-21 ENCOUNTER — Encounter: Payer: Self-pay | Admitting: Orthopaedic Surgery

## 2024-07-22 ENCOUNTER — Encounter: Payer: Self-pay | Admitting: Physician Assistant

## 2024-07-22 DIAGNOSIS — I2510 Atherosclerotic heart disease of native coronary artery without angina pectoris: Secondary | ICD-10-CM | POA: Insufficient documentation

## 2024-07-22 DIAGNOSIS — I10 Essential (primary) hypertension: Secondary | ICD-10-CM | POA: Insufficient documentation

## 2024-07-22 DIAGNOSIS — E7800 Pure hypercholesterolemia, unspecified: Secondary | ICD-10-CM | POA: Insufficient documentation

## 2024-07-22 NOTE — Assessment & Plan Note (Signed)
 Previous monitors without significant arrhythmia.  Patient placed on beta-blocker therapy for empiric management of possible SVT with improved symptoms.***

## 2024-07-22 NOTE — Assessment & Plan Note (Signed)
 Minimal CAD by CCTA 12/2022.***

## 2024-07-22 NOTE — Progress Notes (Unsigned)
 OFFICE NOTE:    Date:  07/23/2024  ID:  Shelly Whitehead, DOB [birth year], MRN [id number] PCP: Joshua Debby CROME, MD  [facility] Providers Cardiologist:  Lonni CROME Nanas, MD        Coronary artery disease CCTA 12/27/2022: CAC score 42.1 (89th percentile); minimal plaque LAD and distal RCA (<25) Aortic regurgitation Mild on TTE in 2017 TTE 02/23/2020: EF 60-65, no RWMA, normal RVSF, trivial MR, trivial AI Palpitations  ?Supraventricular Tachycardia Previous workup at [facility]; started on Toprol -XL for empiric Rx of ?SVT Monitor 09/2020: NSR, rare PVCs/PACs Diabetes mellitus Type 2, insulin -dependent Hypertension Hyperlipidemia   Snoring Sleep study 10/2023: No OSA        Discussed the use of AI scribe software for clinical note transcription with the patient, who gave verbal consent to proceed. History of Present Illness Shelly Whitehead is a 58 y.o. female who returns for follow-up of CAD, palpitations.  She was last seen by Dr. Nanas in 07/2023.  Sleep study was obtained secondary to snoring.  This was negative for OSA.  She has been experiencing low blood pressure, which prompted her to attempt discontinuing metoprolol .  She experienced palpitations and a sensation of her heartbeat when she stopped it. Her blood pressure was recorded at 100/78 mmHg during a recent visit, and she sometimes feels lightheaded or dizzy upon standing. No chest discomfort, shortness of breath, syncope, or leg swelling. She does not smoke.  ROS-See HPI    Studies Reviewed:  EKG Interpretation Date/Time:  Friday July 23 2024 08:00:11 EDT Ventricular Rate:  74 PR Interval:  162 QRS Duration:  80 QT Interval:  362 QTC Calculation: 401 R Axis:   8  Text Interpretation: Normal sinus rhythm Possible Left atrial enlargement RSR prime V1-V2 When compared with ECG of 23-Jul-2023 08:16, No significant change was found Confirmed by Lelon Hamilton [id number]) on 07/23/2024 8:08:21 AM    Labs-chart  review 02/19/2024: K 4.5, creatinine 1.11, ALT 25, total cholesterol 117, HDL 48, LDL 47, triglycerides 108, Hgb 15, TSH 2.08 05/13/2024: A1c 6.1         Physical Exam:  VS:  BP 122/70   Pulse 74   Ht 5' 7 (1.702 m)   Wt 201 lb 12.8 oz (91.5 kg)   SpO2 96%   BMI 31.61 kg/m        Wt Readings from Last 3 Encounters:  07/23/24 201 lb 12.8 oz (91.5 kg)  07/06/24 199 lb 12.8 oz (90.6 kg)  05/20/24 204 lb 6.4 oz (92.7 kg)    Constitutional:      Appearance: Healthy appearance. Not in distress.  Neck:     Vascular: No carotid bruit. JVD normal.  Pulmonary:     Breath sounds: Normal breath sounds. No wheezing. No rales.  Cardiovascular:     Normal rate. Regular rhythm.     Murmurs: There is no murmur.  Edema:    Peripheral edema absent.  Abdominal:     Palpations: Abdomen is soft.       Assessment and Plan:    Assessment & Plan Coronary artery disease involving native coronary artery of native heart without angina pectoris Minimal CAD by CCTA 12/2022.  She is doing well without chest symptoms to suggest angina. -Continue atorvastatin  20 mg daily Paroxysmal SVT (supraventricular tachycardia) (HCC) Previous monitors without significant arrhythmia.  Patient placed on beta-blocker therapy for empiric management of possible SVT with improved symptoms.  She has had some low blood pressures recently and tried to come  off of metoprolol  completely.  However, she had worsening palpitations with this and resumed it. -Decrease metoprolol  succinate to 25 mg daily Pure hypercholesterolemia LDL optimal.  Continue atorvastatin  20 mg daily. Essential hypertension Blood pressure has been running somewhat low at home with symptoms.  She has lost 20 pounds over the past year, which is likely improved her blood pressure.  Decrease metoprolol  as noted to 25 mg daily. Type 2 diabetes mellitus with other specified complication, without long-term current use of insulin  (HCC) A1c recently 6.1, which is  well controlled.         Dispo:  Return in about 1 year (around 07/23/2025) for Routine Follow Up with Dr. Kate.  Signed, Glendia Ferrier, PA-C

## 2024-07-23 ENCOUNTER — Ambulatory Visit: Attending: Physician Assistant | Admitting: Physician Assistant

## 2024-07-23 ENCOUNTER — Encounter: Payer: Self-pay | Admitting: Physician Assistant

## 2024-07-23 ENCOUNTER — Other Ambulatory Visit (HOSPITAL_COMMUNITY): Payer: Self-pay

## 2024-07-23 VITALS — BP 122/70 | HR 74 | Ht 67.0 in | Wt 201.8 lb

## 2024-07-23 DIAGNOSIS — E1169 Type 2 diabetes mellitus with other specified complication: Secondary | ICD-10-CM

## 2024-07-23 DIAGNOSIS — E7800 Pure hypercholesterolemia, unspecified: Secondary | ICD-10-CM

## 2024-07-23 DIAGNOSIS — I471 Supraventricular tachycardia, unspecified: Secondary | ICD-10-CM | POA: Diagnosis not present

## 2024-07-23 DIAGNOSIS — I10 Essential (primary) hypertension: Secondary | ICD-10-CM | POA: Diagnosis not present

## 2024-07-23 DIAGNOSIS — I2510 Atherosclerotic heart disease of native coronary artery without angina pectoris: Secondary | ICD-10-CM | POA: Diagnosis not present

## 2024-07-23 MED ORDER — METOPROLOL SUCCINATE ER 25 MG PO TB24
25.0000 mg | ORAL_TABLET | Freq: Every day | ORAL | 3 refills | Status: AC
  Filled 2024-07-23: qty 30, 30d supply, fill #0
  Filled 2024-08-27: qty 30, 30d supply, fill #1
  Filled 2024-09-26: qty 30, 30d supply, fill #2
  Filled 2024-10-23: qty 30, 30d supply, fill #3
  Filled 2024-11-23: qty 30, 30d supply, fill #4
  Filled 2024-12-10 – 2024-12-17 (×2): qty 30, 30d supply, fill #5

## 2024-07-23 NOTE — Assessment & Plan Note (Signed)
 LDL optimal.  Continue atorvastatin  20 mg daily.

## 2024-07-23 NOTE — Patient Instructions (Signed)
 Medication Instructions:  Your physician has recommended you make the following change in your medication:   REDUCE the Metoprolol  to 25 mg taking 1 daily  *If you need a refill on your cardiac medications before your next appointment, please call your pharmacy*  Lab Work: None ordered  If you have labs (blood work) drawn today and your tests are completely normal, you will receive your results only by: MyChart Message (if you have MyChart) OR A paper copy in the mail If you have any lab test that is abnormal or we need to change your treatment, we will call you to review the results.  Testing/Procedures: None ordered  Follow-Up: At [facility], you and your health needs are our priority.  As part of our continuing mission to provide you with exceptional heart care, our providers are all part of one team.  This team includes your primary Cardiologist (physician) and Advanced Practice Providers or APPs (Physician Assistants and Nurse Practitioners) who all work together to provide you with the care you need, when you need it.  Your next appointment:   1 year(s)  Provider:   Lonni LITTIE Nanas, MD    We recommend signing up for the patient portal called MyChart.  Sign up information is provided on this After Visit Summary.  MyChart is used to connect with patients for Virtual Visits (Telemedicine).  Patients are able to view lab/test results, encounter notes, upcoming appointments, etc.  Non-urgent messages can be sent to your provider as well.   To learn more about what you can do with MyChart, go to [email or web address].   Other Instructions

## 2024-07-23 NOTE — Assessment & Plan Note (Signed)
 Blood pressure has been running somewhat low at home with symptoms.  She has lost 20 pounds over the past year, which is likely improved her blood pressure.  Decrease metoprolol  as noted to 25 mg daily.

## 2024-07-27 ENCOUNTER — Ambulatory Visit (INDEPENDENT_AMBULATORY_CARE_PROVIDER_SITE_OTHER): Admitting: Orthopaedic Surgery

## 2024-07-27 DIAGNOSIS — M25512 Pain in left shoulder: Secondary | ICD-10-CM | POA: Diagnosis not present

## 2024-07-27 DIAGNOSIS — G8929 Other chronic pain: Secondary | ICD-10-CM | POA: Diagnosis not present

## 2024-07-27 NOTE — Progress Notes (Signed)
 Office Visit Note   Patient: Shelly Whitehead           Date of Birth: 06/30/1966           MRN: [id number] Visit Date: 07/27/2024              Requested by: Joshua Debby CROME, MD [address] [place],  [place] [address] PCP: Joshua Debby CROME, MD   Assessment & Plan: Visit Diagnoses:  1. Chronic left shoulder pain     Plan: History of Present Illness Shelly Whitehead is a 58 year old female who presents for MRI discussion.  Her left shoulder pain worsened after a COVID-19 infection. Physical therapy previously provided some relief. She has not received any injections for the current shoulder pain but is open to considering it despite a past painful experience with a shoulder injection on the opposite shoulder.  Left shoulder exam unchanged.  Results RADIOLOGY Shoulder MRI: Small partial-thickness tear of the supraspinatus tendon, tendinosis of the rotator cuff  Assessment and Plan Left shoulder partial thickness rotator cuff tear with tendinosis MRI shows small partial thickness tear of supraspinatus tendon with tendinosis. Symptoms worsened, possibly post-COVID-19. Condition expected to improve in 6-12 months. - Order ultrasound-guided intraarticular injection by Doctor Burnetta. - Advise continuation of home exercises from physical therapy.  Follow-Up Instructions: No follow-ups on file.   Orders:  No orders of the defined types were placed in this encounter.  No orders of the defined types were placed in this encounter.     Procedures: No procedures performed   Clinical Data: No additional findings.   Subjective: Chief Complaint  Patient presents with   Left Shoulder - Pain    HPI  Review of Systems  Constitutional: Negative.   HENT: Negative.    Eyes: Negative.   Respiratory: Negative.    Cardiovascular: Negative.   Endocrine: Negative.   Musculoskeletal: Negative.   Neurological: Negative.   Hematological: Negative.   Psychiatric/Behavioral:  Negative.    All other systems reviewed and are negative.    Objective: Vital Signs: There were no vitals taken for this visit.  Physical Exam Vitals and nursing note reviewed.  Constitutional:      Appearance: She is well-developed.  HENT:     Head: Normocephalic and atraumatic.  Pulmonary:     Effort: Pulmonary effort is normal.  Abdominal:     Palpations: Abdomen is soft.  Musculoskeletal:     Cervical back: Neck supple.  Skin:    General: Skin is warm.     Capillary Refill: Capillary refill takes less than 2 seconds.  Neurological:     Mental Status: She is alert and oriented to person, place, and time.  Psychiatric:        Behavior: Behavior normal.        Thought Content: Thought content normal.        Judgment: Judgment normal.     Ortho Exam  Specialty Comments:  No specialty comments available.  Imaging: No results found.   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, with  long-term current use of insulin  (HCC) 04/24/2023   Chronic renal disease, stage 2, mildly decreased glomerular filtration rate (GFR) between 60-89 mL/min/1.73 square meter 07/25/2022   Encounter for general adult medical examination with abnormal findings 01/23/2022   Paroxysmal SVT (supraventricular tachycardia) (HCC) 01/22/2022   Class 2 severe obesity due to excess calories with serious comorbidity and body mass index (BMI) of 36.0 to 36.9 in adult [facility]) 05/13/2021   Chronic migraine 12/16/2017   Past Medical History:   Diagnosis Date   Adhesive capsulitis of right shoulder    ASCUS of cervix with negative high risk HPV    CAD (coronary artery disease) 07/22/2024   CCTA 12/27/2022: CAC score 42.1 (89th percentile); minimal plaque LAD and distal RCA (<25)    Diabetes mellitus without complication (HCC)    Migraine with aura and with status migrainosus, not intractable    Postmenopausal bleeding    SVT (supraventricular tachycardia) (HCC)     Family History  Problem Relation Age of Onset   Alzheimer's disease Mother    Alcohol abuse Mother    Bladder Cancer Father    Cancer Father    Breast cancer Neg Hx    BRCA 1/2 Neg Hx     Past Surgical History:  Procedure Laterality Date   APPENDECTOMY     CHOLECYSTECTOMY  1998   GALLBLADDER SURGERY  1998   SHOULDER SURGERY  2016   Social History   Occupational History   Occupation: attorney  Tobacco Use   Smoking status: Never   Smokeless tobacco: Never  Vaping Use   Vaping status: Never Used  Substance and Sexual Activity   Alcohol use: Yes    Alcohol/week: 2.0 - 3.0 standard drinks of alcohol    Types: 2 - 3 Cans of beer per week   Drug use: Never   Sexual activity: Yes    Partners: Female    Comment: 1st intercourse 58yo

## 2024-07-28 ENCOUNTER — Encounter: Payer: Self-pay | Admitting: Internal Medicine

## 2024-07-28 ENCOUNTER — Other Ambulatory Visit (HOSPITAL_COMMUNITY): Payer: Self-pay

## 2024-07-28 ENCOUNTER — Other Ambulatory Visit: Payer: Self-pay | Admitting: Internal Medicine

## 2024-07-28 MED ORDER — NOVOLOG FLEXPEN 100 UNIT/ML ~~LOC~~ SOPN
PEN_INJECTOR | SUBCUTANEOUS | 1 refills | Status: DC
  Filled 2024-07-28: qty 3, 20d supply, fill #0

## 2024-07-28 MED ORDER — INSULIN PEN NEEDLE 32G X 4 MM MISC
1.0000 | Freq: Three times a day (TID) | 1 refills | Status: DC
  Filled 2024-07-28: qty 100, 31d supply, fill #0

## 2024-07-29 ENCOUNTER — Other Ambulatory Visit (HOSPITAL_COMMUNITY): Payer: Self-pay

## 2024-07-29 MED ORDER — INSULIN LISPRO (1 UNIT DIAL) 100 UNIT/ML (KWIKPEN)
15.0000 [IU] | PEN_INJECTOR | Freq: Every day | SUBCUTANEOUS | 1 refills | Status: DC
  Filled 2024-07-29: qty 15, 100d supply, fill #0

## 2024-07-29 NOTE — Telephone Encounter (Signed)
 Okay to change to Humalog  per Dr. Sam.

## 2024-08-04 NOTE — Telephone Encounter (Signed)
 Pharmacy Patient Advocate Encounter  Received notification from [employer] that Prior Authorization for Dexcom G7 sensor has been APPROVED from 07/14/24 to 07/14/25   PA #/Case ID/Reference #: [id number]

## 2024-08-05 ENCOUNTER — Other Ambulatory Visit (HOSPITAL_COMMUNITY): Payer: Self-pay

## 2024-08-07 ENCOUNTER — Encounter: Payer: Self-pay | Admitting: Internal Medicine

## 2024-08-11 ENCOUNTER — Ambulatory Visit: Admitting: Sports Medicine

## 2024-08-11 ENCOUNTER — Encounter: Payer: Self-pay | Admitting: Sports Medicine

## 2024-08-11 ENCOUNTER — Other Ambulatory Visit: Payer: Self-pay

## 2024-08-11 DIAGNOSIS — M25512 Pain in left shoulder: Secondary | ICD-10-CM | POA: Diagnosis not present

## 2024-08-11 DIAGNOSIS — G8929 Other chronic pain: Secondary | ICD-10-CM | POA: Diagnosis not present

## 2024-08-11 MED ORDER — BUPIVACAINE HCL 0.25 % IJ SOLN
2.0000 mL | INTRAMUSCULAR | Status: AC | PRN
  Administered 2024-08-11: 2 mL via INTRA_ARTICULAR

## 2024-08-11 MED ORDER — METHYLPREDNISOLONE ACETATE 40 MG/ML IJ SUSP
40.0000 mg | INTRAMUSCULAR | Status: AC | PRN
  Administered 2024-08-11: 40 mg via INTRA_ARTICULAR

## 2024-08-11 MED ORDER — LIDOCAINE HCL 1 % IJ SOLN
2.0000 mL | INTRAMUSCULAR | Status: AC | PRN
  Administered 2024-08-11: 2 mL

## 2024-08-11 NOTE — Progress Notes (Signed)
   Procedure Note  Patient: Shelly Whitehead             Date of Birth: [birth year]           MRN: [id number]             Visit Date: 08/11/2024  Procedures: Visit Diagnoses:  1. Chronic left shoulder pain    Large Joint Inj: L glenohumeral on 08/11/2024 10:08 AM Indications: pain Details: 22 G 3.5 in needle, ultrasound-guided posterior approach Medications: 2 mL lidocaine 1 %; 2 mL bupivacaine 0.25 %; 40 mg methylPREDNISolone acetate 40 MG/ML Outcome: tolerated well, no immediate complications  US -guided glenohumeral joint injection, left shoulder After discussion on risks/benefits/indications, informed verbal consent was obtained. A timeout was then performed. The patient was positioned lying lateral recumbent on examination table. The patient's shoulder was prepped with betadine and multiple alcohol swabs and utilizing ultrasound guidance, the patient's glenohumeral joint was identified on ultrasound. Using ultrasound guidance a 22-gauge, 3.5 inch needle with a mixture of 2:2:1 cc's lidocaine:bupivicaine:depomedrol was directed from a lateral to medial direction via in-plane technique into the glenohumeral joint with visualization of appropriate spread of injectate into the joint. Patient tolerated the procedure well without immediate complications.      Procedure, treatment alternatives, risks and benefits explained, specific risks discussed. Consent was given by the patient. Immediately prior to procedure a time out was called to verify the correct patient, procedure, equipment, support staff and site/side marked as required. Patient was prepped and draped in the usual sterile fashion.     - patient tolerated procedure well, discussed post-injection protocol - follow-up with Dr. Jerri as indicated; I am happy to see them as needed  Lonell Sprang, DO Primary Care Sports Medicine Physician  [facility]  This note was dictated using Dragon naturally speaking software  and may contain errors in syntax, spelling, or content which have not been identified prior to signing this note.

## 2024-08-12 ENCOUNTER — Encounter: Payer: Self-pay | Admitting: Cardiology

## 2024-08-12 ENCOUNTER — Other Ambulatory Visit (HOSPITAL_COMMUNITY): Payer: Self-pay

## 2024-08-23 ENCOUNTER — Encounter: Admitting: Internal Medicine

## 2024-08-30 ENCOUNTER — Other Ambulatory Visit (HOSPITAL_COMMUNITY): Payer: Self-pay

## 2024-08-31 ENCOUNTER — Ambulatory Visit: Payer: Self-pay | Admitting: Internal Medicine

## 2024-08-31 ENCOUNTER — Other Ambulatory Visit (HOSPITAL_COMMUNITY): Payer: Self-pay

## 2024-08-31 ENCOUNTER — Encounter: Payer: Self-pay | Admitting: Internal Medicine

## 2024-08-31 ENCOUNTER — Ambulatory Visit: Admitting: Internal Medicine

## 2024-08-31 VITALS — BP 124/76 | HR 79 | Temp 97.9°F | Ht 67.0 in | Wt 202.8 lb

## 2024-08-31 DIAGNOSIS — Z0000 Encounter for general adult medical examination without abnormal findings: Secondary | ICD-10-CM | POA: Diagnosis not present

## 2024-08-31 DIAGNOSIS — E113393 Type 2 diabetes mellitus with moderate nonproliferative diabetic retinopathy without macular edema, bilateral: Secondary | ICD-10-CM | POA: Diagnosis not present

## 2024-08-31 DIAGNOSIS — E785 Hyperlipidemia, unspecified: Secondary | ICD-10-CM

## 2024-08-31 DIAGNOSIS — Z23 Encounter for immunization: Secondary | ICD-10-CM | POA: Diagnosis not present

## 2024-08-31 DIAGNOSIS — Z0001 Encounter for general adult medical examination with abnormal findings: Secondary | ICD-10-CM | POA: Insufficient documentation

## 2024-08-31 DIAGNOSIS — Z794 Long term (current) use of insulin: Secondary | ICD-10-CM | POA: Diagnosis not present

## 2024-08-31 LAB — CBC WITH DIFFERENTIAL/PLATELET
Basophils Absolute: 0 K/uL (ref 0.0–0.1)
Basophils Relative: 0.8 % (ref 0.0–3.0)
Eosinophils Absolute: 0.1 K/uL (ref 0.0–0.7)
Eosinophils Relative: 2.8 % (ref 0.0–5.0)
HCT: 40.7 % (ref 36.0–46.0)
Hemoglobin: 13.5 g/dL (ref 12.0–15.0)
Lymphocytes Relative: 35.9 % (ref 12.0–46.0)
Lymphs Abs: 1.6 K/uL (ref 0.7–4.0)
MCHC: 33.1 g/dL (ref 30.0–36.0)
MCV: 88 fl (ref 78.0–100.0)
Monocytes Absolute: 0.3 K/uL (ref 0.1–1.0)
Monocytes Relative: 7.7 % (ref 3.0–12.0)
Neutro Abs: 2.3 K/uL (ref 1.4–7.7)
Neutrophils Relative %: 52.8 % (ref 43.0–77.0)
Platelets: 230 K/uL (ref 150.0–400.0)
RBC: 4.62 Mil/uL (ref 3.87–5.11)
RDW: 14.5 % (ref 11.5–15.5)
WBC: 4.4 K/uL (ref 4.0–10.5)

## 2024-08-31 LAB — BASIC METABOLIC PANEL WITH GFR
BUN: 18 mg/dL (ref 6–23)
CO2: 27 meq/L (ref 19–32)
Calcium: 8.8 mg/dL (ref 8.4–10.5)
Chloride: 105 meq/L (ref 96–112)
Creatinine, Ser: 1.08 mg/dL (ref 0.40–1.20)
GFR: 56.65 mL/min — ABNORMAL LOW (ref >60.00)
Glucose, Bld: 179 mg/dL — ABNORMAL HIGH (ref 70–99)
Potassium: 4.2 meq/L (ref 3.5–5.1)
Sodium: 138 meq/L (ref 135–145)

## 2024-08-31 LAB — HEMOGLOBIN A1C: Hgb A1c MFr Bld: 7.7 % — ABNORMAL HIGH (ref 4.6–6.5)

## 2024-08-31 MED ORDER — ATORVASTATIN CALCIUM 20 MG PO TABS
20.0000 mg | ORAL_TABLET | Freq: Every day | ORAL | 1 refills | Status: AC
  Filled 2024-08-31: qty 30, 30d supply, fill #0
  Filled 2024-09-26: qty 30, 30d supply, fill #1
  Filled 2024-10-23: qty 30, 30d supply, fill #2
  Filled 2024-11-23: qty 30, 30d supply, fill #3
  Filled 2024-12-10 – 2024-12-17 (×2): qty 30, 30d supply, fill #4

## 2024-08-31 NOTE — Patient Instructions (Signed)

## 2024-08-31 NOTE — Progress Notes (Signed)
 Subjective:  Patient ID: Shelly Whitehead, female    DOB: Jul 11, 1966  Age: 58 y.o. MRN: [id number]  CC: Annual Exam, Diabetes, and Hyperlipidemia   HPI Shelly Whitehead presents for a CPX and f/up ---  Discussed the use of AI scribe software for clinical note transcription with the patient, who gave verbal consent to proceed.  History of Present Illness Shelly Whitehead is a 58 year old female who presents for a follow-up visit to review her recent cardiology evaluation and vaccination status.  She recently underwent a cardiology evaluation, including an EKG. She remains physically active without experiencing dizziness, lightheadedness, or palpitations during exertion. She mentioned reducing her metoprolol  dose.  Her kidney function has not been assessed since April, and her last A1c was in July. She has a follow-up appointment with endocrinology scheduled for January.  Regarding vaccinations, she has not yet received her flu or COVID vaccines but plans to obtain them at a drugstore. She is considering the pneumonia vaccine, noting previous adverse reactions, but has not had pneumonia recently.  No symptoms of high or low blood pressure, dizziness, lightheadedness, or palpitations.   Outpatient Medications Prior to Visit  Medication Sig Dispense Refill   Continuous Glucose Sensor (DEXCOM G7 SENSOR) MISC Apply 1 sensor every 10 days. 9 each 3   metoprolol  succinate (TOPROL -XL) 25 MG 24 hr tablet Take 1 tablet (25 mg total) by mouth daily. 90 tablet 3   Semaglutide , 2 MG/DOSE, 8 MG/3ML SOPN Inject 2 mg as directed once a week. 9 mL 3   atorvastatin  (LIPITOR) 20 MG tablet Take 1 tablet (20 mg total) by mouth daily. 90 tablet 1   No facility-administered medications prior to visit.    ROS Review of Systems  Constitutional:  Negative for appetite change, chills, diaphoresis, fatigue and fever.  HENT: Negative.    Eyes: Negative.   Respiratory: Negative.  Negative for cough, choking,  shortness of breath and wheezing.   Cardiovascular:  Negative for chest pain, palpitations and leg swelling.  Gastrointestinal: Negative.  Negative for abdominal pain, constipation, diarrhea, nausea and vomiting.  Genitourinary: Negative.  Negative for difficulty urinating.  Musculoskeletal: Negative.  Negative for arthralgias and myalgias.  Skin: Negative.   Neurological:  Negative for dizziness and weakness.  Hematological:  Negative for adenopathy. Does not bruise/bleed easily.  Psychiatric/Behavioral: Negative.      Objective:  BP 124/76 (BP Location: Left Arm, Patient Position: Sitting, Cuff Size: Normal)   Pulse 79   Temp 97.9 F (36.6 C) (Oral)   Ht 5' 7 (1.702 m)   Wt 202 lb 12.8 oz (92 kg)   SpO2 98%   BMI 31.76 kg/m   BP Readings from Last 3 Encounters:  08/31/24 124/76  07/23/24 122/70  07/06/24 112/70    Wt Readings from Last 3 Encounters:  08/31/24 202 lb 12.8 oz (92 kg)  07/23/24 201 lb 12.8 oz (91.5 kg)  07/06/24 199 lb 12.8 oz (90.6 kg)    Physical Exam Vitals reviewed.  Constitutional:      Appearance: Normal appearance.  HENT:     Nose: Nose normal.     Mouth/Throat:     Mouth: Mucous membranes are moist.  Eyes:     General: No scleral icterus.    Conjunctiva/sclera: Conjunctivae normal.  Cardiovascular:     Rate and Rhythm: Normal rate and regular rhythm.     Heart sounds: No murmur heard.    No friction rub. No gallop.  Pulmonary:     Effort: Pulmonary  effort is normal.     Breath sounds: No stridor. No wheezing, rhonchi or rales.  Abdominal:     General: Abdomen is flat.     Palpations: There is no mass.     Tenderness: There is no abdominal tenderness. There is no guarding.     Hernia: No hernia is present.  Musculoskeletal:        General: Normal range of motion.     Cervical back: Neck supple.     Right lower leg: No edema.     Left lower leg: No edema.  Lymphadenopathy:     Cervical: No cervical adenopathy.  Skin:    General:  Skin is warm and dry.  Neurological:     General: No focal deficit present.     Mental Status: She is alert.  Psychiatric:        Mood and Affect: Mood normal.        Behavior: Behavior normal.     Lab Results  Component Value Date   WBC 4.4 08/31/2024   HGB 13.5 08/31/2024   HCT 40.7 08/31/2024   PLT 230.0 08/31/2024   GLUCOSE 179 (H) 08/31/2024   CHOL 117 02/19/2024   TRIG 108.0 02/19/2024   HDL 48.00 02/19/2024   LDLCALC 47 02/19/2024   ALT 25 02/19/2024   AST 23 02/19/2024   NA 138 08/31/2024   K 4.2 08/31/2024   CL 105 08/31/2024   CREATININE 1.08 08/31/2024   BUN 18 08/31/2024   CO2 27 08/31/2024   TSH 2.08 02/19/2024   HGBA1C 7.7 (H) 08/31/2024   MICROALBUR <0.7 02/19/2024    MR Shoulder Left w/o contrast Result Date: 07/15/2024 MR SHOULDER WITHOUT IV CONTRAST LEFT COMPARISON: None. CLINICAL HISTORY: Left shoulder pain. PULSE SEQUENCES: Ax PD FS, Sag T2 FS, Cor T1 & COR T2 FS FINDINGS: Bones: There is no fracture or contusion pattern. Mild degenerative change without accelerated arthrosis. No significant joint effusion is present. Rotator cuff: There is mild insertional tendinosis of the anterior supraspinatus tendon with suspicion of a small partial tear. No significant partial or full-thickness tear is identified. The infraspinatus, subscapularis and teres minor tendons are unremarkable. No significant fatty atrophy of the rotator cuff muscles. Labrum and biceps tendon: The biceps tendon is intact. There is blunting of the posterior labrum. There is no discrete labral tear identified. IMPRESSION: Mild degenerative changes without accelerated arthrosis. Mild insertional tendinosis of the anterior supraspinatus tendon with suspicion of a small partial tear. No significant partial or full-thickness tear is present. Blunting of the posterior labrum without discrete labral tear. Biceps tendon intact. 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 -     Basic metabolic panel with GFR; Future -     CBC with Differential/Platelet; Future -     Hemoglobin A1c; Future  Immunization due -     Pneumococcal conjugate vaccine 20-valent  Encounter for general adult medical examination with abnormal findings- Exam completed, labs reviewed, vaccines reviewed and updated, cancer screenings are UTD, pt ed material was given.   Hyperlipidemia LDL goal <100- LDL goal achieved. Doing well on the statin  -     Atorvastatin  Calcium ; Take 1 tablet (20 mg total) by mouth daily.  Dispense: 90 tablet; Refill: 1  Follow-up: Return in about 6 months (around 03/01/2025).  Debby Molt, MD

## 2024-09-01 ENCOUNTER — Other Ambulatory Visit (HOSPITAL_COMMUNITY): Payer: Self-pay

## 2024-09-13 ENCOUNTER — Encounter: Payer: Self-pay | Admitting: Radiology

## 2024-09-27 ENCOUNTER — Other Ambulatory Visit: Payer: Self-pay

## 2024-09-27 ENCOUNTER — Other Ambulatory Visit (HOSPITAL_COMMUNITY): Payer: Self-pay

## 2024-09-28 ENCOUNTER — Other Ambulatory Visit: Payer: Self-pay

## 2024-10-15 ENCOUNTER — Telehealth: Payer: Self-pay

## 2024-10-15 ENCOUNTER — Telehealth: Payer: Self-pay | Admitting: Internal Medicine

## 2024-10-15 ENCOUNTER — Other Ambulatory Visit (HOSPITAL_COMMUNITY): Payer: Self-pay

## 2024-10-15 NOTE — Telephone Encounter (Signed)
 Pharmacy Patient Advocate Encounter   Received notification from Pt Calls Messages that prior authorization for Ozempic  (2 MG/DOSE) 8MG /3ML pen-injectors is required/requested.   Insurance verification completed.   The patient is insured through [employer].   Per test claim: PA required; PA submitted to above mentioned insurance via Latent Key/confirmation #/EOC [id number] Status is pending

## 2024-10-15 NOTE — Telephone Encounter (Signed)
 Patient came in and updated her insurance card.  She stated that the Prior Auth for her Ozempic  needs to be redone [employer] was effective 12.01.2025

## 2024-10-15 NOTE — Telephone Encounter (Signed)
 Pharmacy Patient Advocate Encounter   Received notification from Patient Advice Request messages that prior authorization for Dexcom G7 sensor is required/requested.   Insurance verification completed.   The patient is insured through [employer].   Per test claim: The current 30 day co-pay is, $45.  No PA needed at this time. This test claim was processed through [facility]- copay amounts may vary at other pharmacies due to pharmacy/plan contracts, or as the patient moves through the different stages of their insurance plan.

## 2024-10-18 NOTE — Telephone Encounter (Signed)
 Pharmacy Patient Advocate Encounter  Received notification from [facility] that Prior Authorization for Ozempic  (2 MG/DOSE) 8MG /3ML pen-injectors has been APPROVED from 10/15/2024 to 10/15/2025

## 2024-10-23 ENCOUNTER — Other Ambulatory Visit (HOSPITAL_COMMUNITY): Payer: Self-pay

## 2024-10-26 ENCOUNTER — Other Ambulatory Visit (HOSPITAL_COMMUNITY): Payer: Self-pay

## 2024-11-16 ENCOUNTER — Ambulatory Visit: Admitting: Internal Medicine

## 2024-11-16 ENCOUNTER — Other Ambulatory Visit (HOSPITAL_COMMUNITY): Payer: Self-pay

## 2024-11-16 ENCOUNTER — Encounter: Payer: Self-pay | Admitting: Internal Medicine

## 2024-11-16 VITALS — BP 112/78 | Ht 67.0 in | Wt 204.0 lb

## 2024-11-16 DIAGNOSIS — Z794 Long term (current) use of insulin: Secondary | ICD-10-CM | POA: Diagnosis not present

## 2024-11-16 DIAGNOSIS — E113399 Type 2 diabetes mellitus with moderate nonproliferative diabetic retinopathy without macular edema, unspecified eye: Secondary | ICD-10-CM | POA: Diagnosis not present

## 2024-11-16 LAB — POCT GLYCOSYLATED HEMOGLOBIN (HGB A1C): Hemoglobin A1C: 6.9 % — AB (ref 4.0–5.6)

## 2024-11-16 MED ORDER — SEMAGLUTIDE (2 MG/DOSE) 8 MG/3ML ~~LOC~~ SOPN
2.0000 mg | PEN_INJECTOR | SUBCUTANEOUS | 3 refills | Status: AC
  Filled 2024-11-16: qty 9, 84d supply, fill #0
  Filled 2024-11-18: qty 3, 28d supply, fill #0
  Filled 2024-12-10: qty 3, 28d supply, fill #1

## 2024-11-16 MED ORDER — DEXCOM G7 SENSOR MISC
1.0000 | 3 refills | Status: AC
  Filled 2024-11-16: qty 9, 90d supply, fill #0
  Filled 2024-12-10: qty 3, 30d supply, fill #0

## 2024-11-16 NOTE — Progress Notes (Signed)
 " Name: Shelly Whitehead  MRN/ DOB: [id number], 1966/06/13   Age/ Sex: 59 y.o., female    PCP: Shelly Debby CROME, MD   Reason for Endocrinology Evaluation: Type 2 Diabetes Mellitus     Date of Initial Endocrinology Visit: 12/03/2021    PATIENT IDENTIFIER: [name] is a 59 y.o. female with a past medical history of T2DM, dyslipidemia, Hx of PCOS and migraine headaches. The patient presented for initial endocrinology clinic visit on 12/03/2021 for consultative assistance with her diabetes management.    HPI: [name] was    Diagnosed with DM 2015 Prior Medications tried/Intolerance: Metformin- diarrhea . Bydureon - renal function was low ? But no side effects . Trulicity -nausea                 Hemoglobin A1c has ranged from 6.6% in 2019, peaking at 7.5% in 2021.     On her initial visit to our clinic she had an A1c of 7.8%, she was on Levemir  and Jardiance  but she was having monthly yeast infection requiring monthly Diflucan  prescriptions.  We stopped Jardiance  and started Ozempic    We discontinued Lantus  and continued Ozempic  12/2023 with an A1c of 5.7%   Losartan  was discontinued by PCP due to hypotension 04/2024  SUBJECTIVE:   During the last visit (05/13/2024): A1c 5.7%      Today (11/16/2024): Shelly Whitehead is here for follow-up on diabetes management.  She checks her blood sugars multiple times daily, through Dexcom . The patient has had hypoglycemic episodes since the last clinic visit.   She follows with [facility]o care for chronic left shoulder pain, received intra-articular injection She was evaluated by cardiology in September, 2025, continued on atorvastatin   Patient has been noted with weight loss since her last visit here She did have COVID infection in 06/2024 and has stuggled to get to herself  No nausea  No constipation or diarrhea  She had an eye exam last week  HOME DIABETES REGIMEN: Ozempic  2 mg weekly     Statin: yes ACE-I/ARB: yes Prior Diabetic  Education: yes    CONTINUOUS GLUCOSE MONITORING RECORD INTERPRETATION    Dates of Recording: 12/24-11/16/2024  Sensor description: dexcom  Results statistics:   CGM use % of time 89  Average and SD 168/31  Time in range 68%  % Time Above 180 31  % Time above 250 1  % Time Below target 0      Glycemic patterns summary: BGs are optimal overnight and fluctuate during the day Hyperglycemic episodes: Postprandial  Hypoglycemic episodes occurred N/A  Overnight periods: Optimal     DIABETIC COMPLICATIONS: Microvascular complications:   Denies: CKD, retinopathy, neuropathy  Last eye exam: Completed 10/2024  Macrovascular complications:   Denies: CAD, PVD, CVA   PAST HISTORY: Past Medical History:  Past Medical History:  Diagnosis Date   Adhesive capsulitis of right shoulder    ASCUS of cervix with negative high risk HPV    CAD (coronary artery disease) 07/22/2024   CCTA 12/27/2022: CAC score 42.1 (89th percentile); minimal plaque LAD and distal RCA (<25)    Diabetes mellitus without complication (HCC)    Migraine with aura and with status migrainosus, not intractable    Postmenopausal bleeding    SVT (supraventricular tachycardia)    Past Surgical History:  Past Surgical History:  Procedure Laterality Date   APPENDECTOMY     CHOLECYSTECTOMY  1998   GALLBLADDER SURGERY  1998   SHOULDER SURGERY  2016    Social  History:  reports that she has never smoked. She has never used smokeless tobacco. She reports current alcohol use of about 2.0 - 3.0 standard drinks of alcohol per week. She reports that she does not use drugs. Family History:  Family History  Problem Relation Age of Onset   Alzheimer's disease Mother    Alcohol abuse Mother    Bladder Cancer Father    Cancer Father    Breast cancer Neg Hx    BRCA 1/2 Neg Hx      HOME MEDICATIONS: Allergies as of 11/16/2024       Reactions   Jardiance  [empagliflozin ] Other (See Comments)   Yeast infection    Metformin Diarrhea, Nausea Only   Oxycodone Itching   Oxycodone-acetaminophen Itching        Medication List        Accurate as of November 16, 2024  8:31 AM. If you have any questions, ask your nurse or doctor.          atorvastatin  20 MG tablet Commonly known as: LIPITOR Take 1 tablet (20 mg total) by mouth daily.   Dexcom G7 Sensor Misc Apply 1 sensor every 10 days.   metoprolol  succinate 25 MG 24 hr tablet Commonly known as: TOPROL -XL Take 1 tablet (25 mg total) by mouth daily.   Ozempic  (2 MG/DOSE) 8 MG/3ML Sopn Generic drug: Semaglutide  (2 MG/DOSE) Inject 2 mg as directed once a week.         ALLERGIES: Allergies  Allergen Reactions   Jardiance  [Empagliflozin ] Other (See Comments)    Yeast infection   Metformin Diarrhea and Nausea Only   Oxycodone Itching   Oxycodone-Acetaminophen Itching     REVIEW OF SYSTEMS: A comprehensive ROS was conducted with the patient and is negative except as per HPI    OBJECTIVE:   VITAL SIGNS: BP 112/78   Ht 5' 7 (1.702 m)   Wt 204 lb (92.5 kg)   BMI 31.95 kg/m    PHYSICAL EXAM:  General: Pt appears well and is in NAD  Lungs: Clear with good BS bilat   Heart: RRR  Abdomen: Soft, nontender  Extremities:  Lower extremities - No pretibial edema.   Neuro: MS is good with appropriate affect, pt is alert and Ox3    DM foot exam: 11/16/2024  The skin of the feet is without sores or ulcerations. The pedal pulses are 2+ on right and 2+ on left. The sensation is intact to a screening 5.07, 10 gram monofilament bilaterally   DATA REVIEWED:  Lab Results  Component Value Date   HGBA1C 6.9 (A) 11/16/2024   HGBA1C 7.7 (H) 08/31/2024   HGBA1C 6.1 (A) 05/13/2024    Latest Reference Range & Units 08/31/24 09:05  Sodium 135 - 145 mEq/L 138  Potassium 3.5 - 5.1 mEq/L 4.2  Chloride 96 - 112 mEq/L 105  CO2 19 - 32 mEq/L 27  Glucose 70 - 99 mg/dL 820 (H)  BUN 6 - 23 mg/dL 18  Creatinine 9.59 - 8.79 mg/dL 8.91   Calcium  8.4 - 10.5 mg/dL 8.8  GFR >39.99 mL/min 56.65 (L)    Old records , labs and images have been reviewed.    ASSESSMENT / PLAN / RECOMMENDATIONS:   1) Type 2 Diabetes Mellitus, Optimally controlled, With diabetic retinopathy complications - Most recent A1c of 6.9 %. Goal A1c < 7.0 %.     -The patient did have elevated A1c of 7.7% since her visit here, this has been attributed to acquiring COVID  infection at the end of the summer, repeat A1c is within goal -No changes at this time, encouraged to continue with healthy lifestyle changes  MEDICATIONS:  - Continue Ozempic  2 mg weekly     EDUCATION / INSTRUCTIONS: BG monitoring instructions: Patient is instructed to check her blood sugars 3 times a day, before meals .   2) Diabetic complications:  Eye: Does not have known diabetic retinopathy.  Neuro/ Feet: Does not have known diabetic peripheral neuropathy. Renal: Patient does not have known baseline CKD. She is  on an ACEI/ARB at present.    Follow-up in 6 months       Signed electronically by: Stefano Redgie Butts, MD  [facility] End[facility]  [facility] [address] [place] [place], [place] [address] Phone: [phone] FAX: [phone]   CC: Shelly Debby CROME, MD [address] [place] [address] Phone: [phone]  Fax: [phone]    Return to Endocrinology clinic as below: No future appointments.    "

## 2024-11-18 ENCOUNTER — Other Ambulatory Visit (HOSPITAL_COMMUNITY): Payer: Self-pay

## 2024-11-18 ENCOUNTER — Other Ambulatory Visit: Payer: Self-pay

## 2024-11-24 ENCOUNTER — Other Ambulatory Visit (HOSPITAL_COMMUNITY): Payer: Self-pay

## 2024-12-01 ENCOUNTER — Other Ambulatory Visit (HOSPITAL_COMMUNITY): Payer: Self-pay

## 2024-12-01 MED ORDER — AMOXICILLIN 500 MG PO CAPS
500.0000 mg | ORAL_CAPSULE | Freq: Three times a day (TID) | ORAL | 0 refills | Status: AC
  Filled 2024-12-01: qty 21, 7d supply, fill #0

## 2024-12-10 ENCOUNTER — Other Ambulatory Visit (HOSPITAL_COMMUNITY): Payer: Self-pay

## 2024-12-10 ENCOUNTER — Other Ambulatory Visit: Payer: Self-pay

## 2024-12-11 ENCOUNTER — Other Ambulatory Visit (HOSPITAL_COMMUNITY): Payer: Self-pay

## 2025-05-17 ENCOUNTER — Ambulatory Visit: Admitting: Internal Medicine
